# Patient Record
Sex: Male | Born: 1970 | Race: White | Hispanic: No | Marital: Married | Smoking: Never smoker
Health system: Southern US, Community
[De-identification: ages and names within clinical notes are randomized; demographics above are authoritative.]

## PROBLEM LIST (undated history)

## (undated) ENCOUNTER — Emergency Department (HOSPITAL_COMMUNITY): Admission: EM | Discharge status: Absent without leave | Payer: Self-pay

## (undated) DIAGNOSIS — E7800 Pure hypercholesterolemia, unspecified: Secondary | ICD-10-CM

## (undated) DIAGNOSIS — E119 Type 2 diabetes mellitus without complications: Secondary | ICD-10-CM

## (undated) HISTORY — PX: HERNIA REPAIR: SHX51

## (undated) HISTORY — DX: Pure hypercholesterolemia, unspecified: E78.00

## (undated) HISTORY — DX: Type 2 diabetes mellitus without complications: E11.9

---

## 2003-06-07 ENCOUNTER — Ambulatory Visit (HOSPITAL_COMMUNITY): Admission: RE | Admit: 2003-06-07 | Discharge: 2003-06-07 | Payer: Self-pay | Admitting: Family Medicine

## 2003-06-07 ENCOUNTER — Encounter: Payer: Self-pay | Admitting: Family Medicine

## 2003-06-19 ENCOUNTER — Ambulatory Visit (HOSPITAL_COMMUNITY): Admission: RE | Admit: 2003-06-19 | Discharge: 2003-06-19 | Payer: Self-pay | Admitting: Family Medicine

## 2003-06-19 ENCOUNTER — Encounter: Payer: Self-pay | Admitting: Family Medicine

## 2007-07-27 ENCOUNTER — Ambulatory Visit (HOSPITAL_COMMUNITY): Admission: RE | Admit: 2007-07-27 | Discharge: 2007-07-27 | Payer: Self-pay | Admitting: General Surgery

## 2007-09-13 ENCOUNTER — Ambulatory Visit (HOSPITAL_COMMUNITY): Admission: RE | Admit: 2007-09-13 | Discharge: 2007-09-13 | Payer: Self-pay | Admitting: General Surgery

## 2011-03-02 NOTE — Op Note (Signed)
NAME:  Micheal Nelson, Micheal Nelson            ACCOUNT NO.:  [id number]   MEDICAL RECORD NO.:  [id number]          PATIENT TYPE:  AMB   LOCATION:  DAY                           FACILITY:  [facility]   PHYSICIAN:  Tilford Pillar, MD      DATE OF BIRTH:  [birth year]   DATE OF PROCEDURE:  09/13/2007  DATE OF DISCHARGE:                               OPERATIVE REPORT   PREOPERATIVE DIAGNOSIS:  Epigastric hernia.   POSTOPERATIVE DIAGNOSIS:  Epigastric hernia.   PROCEDURE:  Laparoscopic epigastric hernia repair with 10 x 15 cm  Proceed trimmed to 10 x 10 cm mesh repair.   SURGEON:  Tilford Pillar, M.D.   ANESTHESIA:  General endotracheal.  Local anesthetic; 1% Sensorcaine  plain.   ESTIMATED BLOOD LOSS:  Minimal.   SPECIMENS:  None.   INDICATIONS FOR PROCEDURE:  The patient is a 40 year old male who  presented to my office as an outpatient with a notable bulge in his  epigastric region.  He said that he had noticed this approximately two  years prior and it had slowly increased in size.  He had not had any  signs or symptoms consistent with incarceration or strangulation.  On  initial evaluation, I was not able to palpate the defect and a CT  evaluation of the abdomen and pelvis was obtained which clearly  demonstrated a small epigastric hernia.  The risks, benefits, and  alternatives of a laparoscopic versus open hernia repair with mesh were  discussed at length with the patient.  The patient's questions and  concerns were addressed and the patient was consented for the planned  laparoscopic hernia repair.   DESCRIPTION OF PROCEDURE:  The patient was taken to the operating room  and was placed in the supine position on the operating table at which  time general anesthesia was administered.  Once the patient was asleep,  he was endotracheally intubated by anesthesia.  At this point a Foley  catheter was placed using sterile technique by myself and then his  abdomen was prepped and draped in the usual  fashion.  A stab incision  was made in the left upper quadrant at Palmer's point and the Veress  needle was inserted.  Saline drop test was utilized to confirm  intraperitoneal placement before initiating pneumoperitoneum.  Once  sufficient pneumoperitoneum was obtained, an incision was made in the  right lateral abdominal wall for placement of a 10 mm trocar.  This  trocar was placed over the laparoscope to visualize entrance into the  peritoneal cavity.  With the trocar in place, the inner cannula was  removed.  The laparoscope was reinserted.  There was no evidence of any  trocar or Veress needle placement injury.  The Veress needle was removed  at this point and at this point a 5 mm trocar was placed inferior to the  10 mm trocar.  This was placed under direct visualization with the  laparoscope.  At this point electrocautery was utilized to dissect the  falciform ligament free from the anterior abdominal wall allowing an  adequate exposure for the epigastric hernia that was midline of  the  falciform ligament.  At this point the 30 degree laparoscope was  inserted and the hernia was probed with a regular grasper.  There was no  incarcerated omental fat or intra-abdominal contents within the hernia.  At this point attention was turned to measuring area around the defect.   Using palpation of the anterior abdominal wall and a marking pen to mark  the plane of the pexying sutures.  The defect was measured and the  pneumoperitoneum was briefly evacuated to allow adequate extracorporeal  measurement which measured approximately 10 x 10 cm.  At this point  pneumoperitoneum was returned.  A 10 x 15 cm piece of Proceed was  brought to the field.  This was trimmed to the appropriate size, marked  for positioning, and using 2.0 Novafil sutures for the pexying sutures,  these were placed at all four quadrants of the mesh.  This was then  rolled and placed in through the 11 mm trocar.  At this  point, the  laparoscope was reinserted into the abdominal cavity.  Additional 5 mm  trocar was placed in the left lateral abdominal wall.  The mesh was  unrolled, positioned appropriately, and through stab incisions at the  four quadrants, the previously marked pexying sites, the Novafil sutures  were pulled through the anterior abdominal wall using an Endoclose  suture passing device.  The sutures were temporarily secured using  Hemostats and once all four quadrants were passed through the anterior  abdominal wall, the sutures were pulled taut to assure appropriate lie  of the mesh against the anterior abdominal wall.  The mesh was in good  position, the sutures were secured and trimmed.  At this point a  Protacker spiral stapling device was brought to the field and was  utilized to tack the mesh circumferentially.  At this point the mesh was  lying in good position.  The pneumoperitoneum was evacuated.  The  trocars were removed.  A 2-0 Vicryl on a UR needle was utilized to  reapproximate the fascia at the 10 mm trocar site.  The skin was washed  and dried with a moist to dry towel.  4-0 Monocryl was utilized to  reapproximate the skin edges at all three trocar sites.  The skin was  then washed and dried with a moist to dry towel again and Benzoin was  applied around all trocar sites and stab incisions.  Half inch Steri-  Strips were placed over the incisions.  The patient was allowed to come  out of general anesthesia and was transferred back to a regular hospital  bed.  The Foley was removed prior to leaving the operating room.  He was  transferred to the postanesthesia care unit in stable condition.  At the  conclusion of the procedure, all needle, sponge, and instrument counts  correct.  The patient tolerated the procedure well.      Tilford Pillar, MD  Electronically Signed     BZ/MEDQ  D:  09/13/2007  T:  09/13/2007  Job:  [id number]

## 2011-03-02 NOTE — H&P (Signed)
NAME:  Micheal Nelson, Micheal Nelson            ACCOUNT NO.:  [id number]   MEDICAL RECORD NO.:  [id number]          PATIENT TYPE:  AMB   LOCATION:  DAY                           FACILITY:  [facility]   PHYSICIAN:  Tilford Pillar, MD      DATE OF BIRTH:  [birth year]   DATE OF ADMISSION:  DATE OF DISCHARGE:  LH                              HISTORY & PHYSICAL   CHIEF COMPLAINT:  Abdominal bulge.   HISTORY OF PRESENT ILLNESS:  The patient is a 40 year old male who  noticed increasing bulge in his epigastric region.  He has had no prior  history of surgeries or prior hernias.  He noted that this has increased  slowly in size over the last several months with some burning discomfort  around the area.  He states he actually noticed the initial bulge more  than 10 years ago but had never thought much of it.  He has had no  history of nausea or vomiting.  No fever or chills or any bowel changes.  He is otherwise a healthy individual.   PAST MEDICAL HISTORY:  None.   PAST SURGICAL HISTORY:  None.   MEDICATIONS:  None.   ALLERGIES:  No known drug allergies.   SOCIAL HISTORY:  No tobacco use.  No alcohol use.  No recreational drug  use.  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   FAMILY HISTORY:  He has no pertinent family history.   REVIEW OF SYSTEMS:  CONSTITUTIONAL:  Unremarkable.  EYES:  Unremarkable.  EARS, NOSE AND THROAT:  Unremarkable.  RESPIRATORY:  Occasional cough.  CARDIOVASCULAR:  Unremarkable.  GASTROINTESTINAL:  Above-mentioned  bulging.  GENITOURINARY:  Unremarkable.  MUSCULOSKELETAL:  Unremarkable.  SKIN:  Unremarkable.  ENDOCRINE:  Unremarkable.  NEUROLOGICAL:  Unremarkable.   PHYSICAL EXAMINATION:  GENERAL APPEARANCE:  This is a healthy, mildly  obese, male who is in no acute distress.  He is alert and oriented x3.  HEENT:  Scalp has no deformities, no masses.  Eyes:  Pupils equal,  round, reactive.  Extraocular movements  are intact. On oral evaluation  the mucosa is pink.  Normal occlusion.  NECK:  Exam reveals midline trachea.  No thyroid nodules.  No cervical  lymphadenopathy.  PULMONARY:  Unlabored respirations.  Clear to auscultation bilaterally.  CARDIOVASCULAR:  Has 2+ radial pulses.  Regular rate and rhythm.  ABDOMEN:  Positive bowel sounds.  Abdomen is soft.  No tenderness is  appreciated.  There is noted bulging of both his umbilicus and his  epigastric region.   PERTINENT LABORATORY AND RADIOGRAPHIC STUDIES:  CT scan evaluation of  abdomen and pelvis demonstrates a small ventral hernia, no incarcerated  bowel is noted within the hernia.   ASSESSMENT/PLAN:  Small epigastric hernia.  We have discussed with the  patient the risks, benefits and alternatives of laparoscopic or possible  open ventral hernia repair.  Initially, the difference between diastasis  and hernia were discussed at length with the patient due to the unusual  location without prior intra-abdominal surgery.  However, an epigastric  hernia  was confirmed with the CT scan evaluation of the patient's  abdomen.  The surgical risks for both laparoscopic and open techniques  were discussed with the patient including, but not limited to, the  possibility of infection, bleeding, mesh infection with required removal  of mesh and interval hernia repair, bowel injury as well as the  possibility of intraoperative cardiac and pulmonary events.  The patient  is comfortable with proceeding and wishes to proceed with laparoscopic  hernia repair.  This will be scheduled for September 13, 2007 at [facility].      Tilford Pillar, MD  Electronically Signed     BZ/MEDQ  D:  08/21/2007  T:  08/22/2007  Job:  [id number]   cc:   [facility]  Fax: [phone]   Melony Overly, Primary Care Physician

## 2011-07-27 LAB — CBC
HCT: 42.5
Platelets: 278
RBC: 4.74

## 2011-07-27 LAB — BASIC METABOLIC PANEL
BUN: 9
Chloride: 100
Creatinine, Ser: 0.79
GFR calc Af Amer: >60
GFR calc non Af Amer: >60
Glucose, Bld: 103 — ABNORMAL HIGH
Potassium: 3.9

## 2017-05-13 MED FILL — PHENTERMINE 37.5 MG TABLET: 37.5 | 30 days supply | Qty: 30 | Fill #0

## 2017-06-10 MED FILL — PHENTERMINE 37.5 MG TABLET: 37.5 | 30 days supply | Qty: 30 | Fill #0

## 2017-07-08 MED FILL — PHENTERMINE 37.5 MG TABLET: 37.5 | 30 days supply | Qty: 30 | Fill #0

## 2017-07-13 DIAGNOSIS — Z23 Encounter for immunization: Secondary | ICD-10-CM | POA: Diagnosis not present

## 2017-08-10 MED FILL — PHENTERMINE 37.5 MG TABLET: 37.5 | 30 days supply | Qty: 30 | Fill #0

## 2018-01-30 ENCOUNTER — Other Ambulatory Visit: Payer: Self-pay

## 2018-01-30 ENCOUNTER — Emergency Department (HOSPITAL_COMMUNITY)
Admission: EM | Admit: 2018-01-30 | Discharge: 2018-01-30 | Disposition: A | Discharge status: Home / Self Care | Payer: No Typology Code available for payment source | Attending: Emergency Medicine | Admitting: Emergency Medicine

## 2018-01-30 ENCOUNTER — Encounter (HOSPITAL_COMMUNITY): Payer: Self-pay | Admitting: Emergency Medicine

## 2018-01-30 ENCOUNTER — Emergency Department (HOSPITAL_COMMUNITY): Payer: No Typology Code available for payment source

## 2018-01-30 DIAGNOSIS — Y92410 Unspecified street and highway as the place of occurrence of the external cause: Secondary | ICD-10-CM | POA: Diagnosis not present

## 2018-01-30 DIAGNOSIS — Z87891 Personal history of nicotine dependence: Secondary | ICD-10-CM | POA: Insufficient documentation

## 2018-01-30 DIAGNOSIS — Y939 Activity, unspecified: Secondary | ICD-10-CM | POA: Insufficient documentation

## 2018-01-30 DIAGNOSIS — S0083XA Contusion of other part of head, initial encounter: Secondary | ICD-10-CM | POA: Diagnosis not present

## 2018-01-30 DIAGNOSIS — Z79899 Other long term (current) drug therapy: Secondary | ICD-10-CM | POA: Diagnosis not present

## 2018-01-30 DIAGNOSIS — Y999 Unspecified external cause status: Secondary | ICD-10-CM | POA: Insufficient documentation

## 2018-01-30 DIAGNOSIS — S0093XA Contusion of unspecified part of head, initial encounter: Secondary | ICD-10-CM

## 2018-01-30 DIAGNOSIS — S0990XA Unspecified injury of head, initial encounter: Secondary | ICD-10-CM | POA: Diagnosis present

## 2018-01-30 MED ORDER — IBUPROFEN 800 MG PO TABS: 800.0000 mg | ORAL_TABLET | Freq: Three times a day (TID) | ORAL | 0 refills | Status: DC

## 2018-01-30 MED ORDER — CYCLOBENZAPRINE HCL 10 MG PO TABS: 10.0000 mg | ORAL_TABLET | Freq: Two times a day (BID) | ORAL | 0 refills | Status: DC | PRN

## 2018-01-30 NOTE — Discharge Instructions (Signed)
Xrays are normal - no fractures You will likely feel worse tomorrow than you do today Ibuprofen 800 mg every 8 hours for pain for several days then as needed Flexeril 10 mg by mouth as needed for muscle strain or tension
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
## 2018-02-06 DIAGNOSIS — S161XXA Strain of muscle, fascia and tendon at neck level, initial encounter: Secondary | ICD-10-CM | POA: Diagnosis not present

## 2018-02-06 DIAGNOSIS — Z1389 Encounter for screening for other disorder: Secondary | ICD-10-CM | POA: Diagnosis not present

## 2018-02-06 DIAGNOSIS — Z6841 Body Mass Index (BMI) 40.0 and over, adult: Secondary | ICD-10-CM | POA: Diagnosis not present

## 2018-02-22 ENCOUNTER — Encounter (HOSPITAL_COMMUNITY): Payer: Self-pay | Admitting: Emergency Medicine

## 2018-02-22 ENCOUNTER — Emergency Department (HOSPITAL_COMMUNITY)
Admission: EM | Admit: 2018-02-22 | Discharge: 2018-02-23 | Disposition: A | Discharge status: Home / Self Care | Payer: 59 | Attending: Emergency Medicine | Admitting: Emergency Medicine

## 2018-02-22 ENCOUNTER — Other Ambulatory Visit: Payer: Self-pay

## 2018-02-22 DIAGNOSIS — X509XXA Other and unspecified overexertion or strenuous movements or postures, initial encounter: Secondary | ICD-10-CM | POA: Insufficient documentation

## 2018-02-22 DIAGNOSIS — Y939 Activity, unspecified: Secondary | ICD-10-CM | POA: Diagnosis not present

## 2018-02-22 DIAGNOSIS — M79671 Pain in right foot: Secondary | ICD-10-CM | POA: Diagnosis not present

## 2018-02-22 DIAGNOSIS — Y999 Unspecified external cause status: Secondary | ICD-10-CM | POA: Diagnosis not present

## 2018-02-22 DIAGNOSIS — S92152A Displaced avulsion fracture (chip fracture) of left talus, initial encounter for closed fracture: Secondary | ICD-10-CM | POA: Insufficient documentation

## 2018-02-22 DIAGNOSIS — S99911A Unspecified injury of right ankle, initial encounter: Secondary | ICD-10-CM | POA: Diagnosis present

## 2018-02-22 DIAGNOSIS — Z79899 Other long term (current) drug therapy: Secondary | ICD-10-CM | POA: Insufficient documentation

## 2018-02-22 DIAGNOSIS — S99921A Unspecified injury of right foot, initial encounter: Secondary | ICD-10-CM | POA: Diagnosis not present

## 2018-02-22 DIAGNOSIS — S92251A Displaced fracture of navicular [scaphoid] of right foot, initial encounter for closed fracture: Secondary | ICD-10-CM | POA: Diagnosis not present

## 2018-02-22 DIAGNOSIS — S82891A Other fracture of right lower leg, initial encounter for closed fracture: Secondary | ICD-10-CM

## 2018-02-22 DIAGNOSIS — Y92481 Parking lot as the place of occurrence of the external cause: Secondary | ICD-10-CM | POA: Insufficient documentation

## 2018-02-22 DIAGNOSIS — S92151A Displaced avulsion fracture (chip fracture) of right talus, initial encounter for closed fracture: Secondary | ICD-10-CM | POA: Diagnosis not present

## 2018-02-22 DIAGNOSIS — M7989 Other specified soft tissue disorders: Secondary | ICD-10-CM | POA: Diagnosis not present

## 2018-02-22 NOTE — ED Triage Notes (Signed)
Patient went out for dinner earlier and fell getting out of his truck.  Patient states that his foot went under and turned when he stepped out.  Patient is unable to put weight on his foot now and has swelling to foot/ankle.

## 2018-02-23 ENCOUNTER — Emergency Department (HOSPITAL_COMMUNITY): Payer: 59

## 2018-02-23 DIAGNOSIS — S99921A Unspecified injury of right foot, initial encounter: Secondary | ICD-10-CM | POA: Diagnosis not present

## 2018-02-23 DIAGNOSIS — Z79899 Other long term (current) drug therapy: Secondary | ICD-10-CM | POA: Diagnosis not present

## 2018-02-23 DIAGNOSIS — M79671 Pain in right foot: Secondary | ICD-10-CM | POA: Diagnosis not present

## 2018-02-23 DIAGNOSIS — S92152A Displaced avulsion fracture (chip fracture) of left talus, initial encounter for closed fracture: Secondary | ICD-10-CM | POA: Diagnosis not present

## 2018-02-23 DIAGNOSIS — S92151A Displaced avulsion fracture (chip fracture) of right talus, initial encounter for closed fracture: Secondary | ICD-10-CM | POA: Diagnosis not present

## 2018-02-23 DIAGNOSIS — M7989 Other specified soft tissue disorders: Secondary | ICD-10-CM | POA: Diagnosis not present

## 2018-02-23 MED ORDER — HYDROCODONE-ACETAMINOPHEN 5-325 MG PO TABS: 1.0000 | ORAL_TABLET | Freq: Four times a day (QID) | ORAL | 0 refills | Status: DC | PRN

## 2018-02-23 MED ORDER — IBUPROFEN 600 MG PO TABS: 600.0000 mg | ORAL_TABLET | Freq: Four times a day (QID) | ORAL | 0 refills | Status: DC | PRN

## 2018-02-23 NOTE — ED Provider Notes (Signed)
[facility] EMERGENCY DEPARTMENT Provider Note   CSN: [id number] Arrival date & time: 02/22/18  2332     History   Chief Complaint Chief Complaint  Patient presents with  . Foot Pain  . Ankle Pain    HPI Micheal Nelson is a 47 y.o. male.  HPI   Micheal Nelson is a 47 y.o. male, patient with no pertinent past medical history, presenting to the ED with right foot and ankle pain beginning evening of 5/8.  States he slipped getting out of a vehicle and his right foot plantar flexed under his weight.  Pain is currently moderate, throbbing, nonradiating.  Improved with ibuprofen.  Denies numbness, head injury, neck/back pain, or any other complaints.     History reviewed. No pertinent past medical history.  There are no active problems to display for this patient.   Past Surgical History:  Procedure Laterality Date  . HERNIA REPAIR          Home Medications    Prior to Admission medications   Medication Sig Start Date End Date Taking? Authorizing Provider  cetirizine (ZYRTEC) 10 MG tablet Take 10 mg by mouth daily.    [provider]  cyclobenzaprine (FLEXERIL) 10 MG tablet Take 1 tablet (10 mg total) by mouth 2 (two) times daily as needed for muscle spasms. 01/30/18   Eber Hong, MD  HYDROcodone-acetaminophen (NORCO/VICODIN) 5-325 MG tablet Take 1-2 tablets by mouth every 6 (six) hours as needed for severe pain. 02/23/18   Maijor Hornig C, PA-C  ibuprofen (ADVIL,MOTRIN) 600 MG tablet Take 1 tablet (600 mg total) by mouth every 6 (six) hours as needed. 02/23/18   Tailor Lucking, Hillard Danker, PA-C    Family History No family history on file.  Social History Social History   Tobacco Use  . Smoking status: Never Smoker  . Smokeless tobacco: Former Neurosurgeon    Types: Chew  Substance Use Topics  . Alcohol use: Never    Frequency: Never  . Drug use: Never     Allergies   Patient has no known allergies.   Review of Systems Review of Systems    Musculoskeletal: Positive for arthralgias.  Skin: Negative for wound.  Neurological: Negative for numbness.     Physical Exam Updated Vital Signs BP 117/81 (BP Location: Right Arm)   Pulse 88   Temp 98.9 F (37.2 C) (Oral)   Resp 16   Ht  (1.803 m)   Wt 122.5 kg (270 lb)   SpO2 97%   BMI 37.66 kg/m   Physical Exam  Constitutional: He appears well-developed and well-nourished. No distress.  HENT:  Head: Normocephalic and atraumatic.  Eyes: Conjunctivae are normal.  Neck: Neck supple.  Cardiovascular: Normal rate, regular rhythm and intact distal pulses.  Pulmonary/Chest: Effort normal.  Musculoskeletal: He exhibits tenderness. He exhibits no deformity.       Feet:  Neurological: He is alert.  Sensation intact in the right foot. Strength with plantar flexion 5/5 in the right ankle.  Strength 4/5 with dorsiflexion. Limping, antalgic gait.  Skin: Skin is warm and dry. Capillary refill takes less than 2 seconds. He is not diaphoretic. No pallor.  Psychiatric: He has a normal mood and affect. His behavior is normal.  Nursing note and vitals reviewed.    ED Treatments / Results  Labs (all labs ordered are listed, but only abnormal results are displayed) Labs Reviewed - No data to display  EKG None  Radiology Dg Ankle Complete  Right  Result Date: 02/23/2018 CLINICAL DATA:  Swelling and pain after fall. EXAM: RIGHT ANKLE - COMPLETE 3+ VIEW COMPARISON:  None. FINDINGS: Small avulsion fractures off the dorsum of the anterior talus and navicular. Soft tissue swelling about the malleoli. Intact ankle and subtalar joints. Calcaneal enthesopathy is seen along plantar dorsal aspect. IMPRESSION: 1. Tiny avulsion fractures off the dorsum of the anterior talus and navicular bones. No joint dislocation. 2. Soft tissue swelling. 3. Calcaneal enthesopathy. Electronically Signed   By: Tollie Eth M.D.   On: 02/23/2018 01:49   Dg Foot Complete Right  Result Date:  02/23/2018 CLINICAL DATA:  Swelling and pain after fall. EXAM: RIGHT FOOT COMPLETE - 3+ VIEW COMPARISON:  None. FINDINGS: Soft tissue swelling about the malleoli and dorsum of the foot. Tiny avulsions across the talonavicular joint involving the dorsum of the anterior talus and tarsal navicular. No joint dislocation. Calcaneal enthesopathy is seen. Intact Lisfranc articulation. Intact Chopart joint. IMPRESSION: Tiny flake avulsions off the dorsum of the anterior talus and navicular bones. Associated soft tissue swelling is noted. Electronically Signed   By: Tollie Eth M.D.   On: 02/23/2018 01:51    Procedures Procedures (including critical care time)  Medications Ordered in ED Medications - No data to display   Initial Impression / Assessment and Plan / ED Course  I have reviewed the triage vital signs and the nursing notes.  Pertinent labs & imaging results that were available during my care of the patient were reviewed by me and considered in my medical decision making (see chart for details).     Patient presents with a right foot and ankle injury.  Avulsion fracture is noted to dorsum of talus and navicular bones.  Patient has plantar and dorsiflexion intact.  Neurovascularly intact.  Placed in a Cam boot, given crutches, orthopedic follow-up. The patient was given instructions for home care as well as return precautions. Patient voices understanding of these instructions, accepts the plan, and is comfortable with discharge.  Final Clinical Impressions(s) / ED Diagnoses   Final diagnoses:  Closed avulsion fracture of right ankle, initial encounter    ED Discharge Orders        Ordered    ibuprofen (ADVIL,MOTRIN) 600 MG tablet  Every 6 hours PRN     02/23/18 0258    HYDROcodone-acetaminophen (NORCO/VICODIN) 5-325 MG tablet  Every 6 hours PRN     02/23/18 0258       Anselm Pancoast, PA-C 02/23/18 0311    Dione Booze, MD 02/23/18 [phone]

## 2018-02-23 NOTE — Discharge Instructions (Signed)
You have been seen today for an ankle injury.  There were noted avulsion fractures to the ankle. Pain: Take 600 mg of ibuprofen every 6 hours or 440 mg (over the counter dose) to 500 mg (prescription dose) of naproxen every 12 hours for the next 3 days. After this time, these medications may be used as needed for pain. Take these medications with food to avoid upset stomach. Choose only one of these medications, do not take them together.  Tylenol: Should you continue to have additional pain while taking the ibuprofen or naproxen, you may add in tylenol as needed. Your daily total maximum amount of tylenol from all sources should be limited to /day for persons without liver problems, or /day for those with liver problems. Vicodin: May take Vicodin as needed for severe pain.  Do not drive or perform other dangerous activities while taking the Vicodin.  Please note that each pill of Vicodin contains 325 mg of Tylenol and the above dosage limits apply. Ice: May apply ice to the area over the next 24 hours for 15 minutes at a time to reduce swelling. Elevation: Keep the extremity elevated as often as possible to reduce pain and inflammation. Support: Wear the cam boot for support and comfort. Wear this until pain resolves.  Exercises: Start by performing these exercises a few times a week, increasing the frequency until you are performing them twice daily.  Follow up: Follow-up with the orthopedic specialist on this matter.  Call the number provided to set up an appointment.

## 2018-02-23 NOTE — Progress Notes (Signed)
Orthopedic Tech Progress Note Patient Details:  Micheal Nelson [birth year] [id number]  Ortho Devices Type of Ortho Device: Crutches, CAM walker Ortho Device/Splint Location: rle Ortho Device/Splint Interventions: Ordered, Application, Adjustment   Post Interventions Patient Tolerated: Well Instructions Provided: Care of device, Adjustment of device   [name] 02/23/2018, 3:39 AM

## 2018-03-07 DIAGNOSIS — M25571 Pain in right ankle and joints of right foot: Secondary | ICD-10-CM | POA: Insufficient documentation

## 2018-03-31 MED FILL — PHENTERMINE 37.5 MG TABLET: 37.5 | 30 days supply | Qty: 30 | Fill #0

## 2018-04-12 ENCOUNTER — Encounter: Payer: Self-pay | Admitting: Family Medicine

## 2018-04-12 ENCOUNTER — Ambulatory Visit (INDEPENDENT_AMBULATORY_CARE_PROVIDER_SITE_OTHER): Payer: Self-pay | Admitting: Family Medicine

## 2018-04-12 VITALS — BP 110/82 | HR 84 | Temp 98.4°F | Ht 71.0 in | Wt 284.0 lb

## 2018-04-12 DIAGNOSIS — Z0000 Encounter for general adult medical examination without abnormal findings: Secondary | ICD-10-CM

## 2018-04-12 NOTE — Patient Instructions (Signed)

## 2018-04-12 NOTE — Progress Notes (Signed)
Micheal Nelson is a 47 y.o. male who presents today with concerns of need for an annual physical exam. He denies any chronic health conditions at this time.  Review of Systems  Constitutional: Negative for chills, fever and malaise/fatigue.  HENT: Negative for congestion, ear discharge, ear pain, sinus pain and sore throat.   Eyes: Negative.   Respiratory: Negative for cough, sputum production and shortness of breath.   Cardiovascular: Negative.  Negative for chest pain.  Gastrointestinal: Negative for abdominal pain, diarrhea, nausea and vomiting.  Genitourinary: Negative for dysuria, frequency, hematuria and urgency.  Musculoskeletal: Negative for myalgias.  Skin: Negative.   Neurological: Negative for headaches.  Endo/Heme/Allergies: Negative.   Psychiatric/Behavioral: Negative.    O: Vitals:   04/12/18 0819  BP: 110/82  Pulse: 84  Temp: 98.4 F (36.9 C)  SpO2: 97%   Physical Exam  Constitutional: He is oriented to person, place, and time. Vital signs are normal. He appears well-developed and well-nourished. He is active.  Non-toxic appearance. He does not have a sickly appearance.  HENT:  Head: Normocephalic.  Right Ear: Hearing, tympanic membrane, external ear and ear canal normal.  Left Ear: Hearing, tympanic membrane, external ear and ear canal normal.  Nose: Nose normal.  Mouth/Throat: Uvula is midline and oropharynx is clear and moist.  Neck: Normal range of motion. Neck supple.  Cardiovascular: Normal rate, regular rhythm, normal heart sounds and normal pulses.  Pulmonary/Chest: Effort normal and breath sounds normal.  Abdominal: Soft. Bowel sounds are normal.  Musculoskeletal: Normal range of motion.  Lymphadenopathy:       Head (right side): No submental and no submandibular adenopathy present.       Head (left side): No submental and no submandibular adenopathy present.    He has no cervical adenopathy.  Neurological: He is alert and oriented to person,  place, and time.  Psychiatric: He has a normal mood and affect.  Vitals reviewed.  A: 1. Physical exam    P: Exam findings, diagnosis etiology and medication use and indications reviewed with patient. Follow- Up and discharge instructions provided. No emergent/urgent issues found on exam.  Patient verbalized understanding of information provided and agrees with plan of care (POC), all questions answered.  1. Physical exam WNL

## 2018-05-31 MED FILL — PHENTERMINE 37.5 MG TABLET: 37.5 | 30 days supply | Qty: 30 | Fill #0

## 2018-07-05 MED FILL — PHENTERMINE 37.5 MG TABLET: 37.5 | 30 days supply | Qty: 30 | Fill #0

## 2018-07-11 ENCOUNTER — Encounter: Payer: Self-pay | Admitting: Family Medicine

## 2018-07-11 ENCOUNTER — Ambulatory Visit (INDEPENDENT_AMBULATORY_CARE_PROVIDER_SITE_OTHER): Payer: Self-pay | Admitting: Family Medicine

## 2018-07-11 VITALS — BP 110/80 | HR 94 | Temp 98.3°F | Wt 285.6 lb

## 2018-07-11 DIAGNOSIS — J329 Chronic sinusitis, unspecified: Secondary | ICD-10-CM

## 2018-07-11 MED ORDER — DM-GUAIFENESIN ER 30-600 MG PO TB12: 2.0000 | ORAL_TABLET | Freq: Two times a day (BID) | ORAL | 0 refills | Status: AC

## 2018-07-11 MED ORDER — AMOXICILLIN-POT CLAVULANATE 875-125 MG PO TABS: 1.0000 | ORAL_TABLET | Freq: Two times a day (BID) | ORAL | 0 refills | Status: AC

## 2018-07-11 MED ORDER — BENZONATATE 200 MG PO CAPS: 200.0000 mg | ORAL_CAPSULE | Freq: Three times a day (TID) | ORAL | 0 refills | Status: DC | PRN

## 2018-07-11 MED FILL — AMOX-CLAV 875-125 MG TABLET: 875-125 | 7 days supply | Qty: 14 | Fill #0

## 2018-07-11 MED FILL — BENZONATATE 200 MG CAPS: 200 | 7 days supply | Qty: 20 | Fill #0

## 2018-07-11 MED FILL — MUCINEX DM ER 600-30 MG TAB: 30-600 | 4 days supply | Qty: 16 | Fill #0

## 2018-07-11 NOTE — Patient Instructions (Addendum)
PLAN< Increase hydration Take antibiotic as prescribed-complete course Take cough medication as needed  Sinusitis, Adult Sinusitis is soreness and inflammation of your sinuses. Sinuses are hollow spaces in the bones around your face. Your sinuses are located:  Around your eyes.  In the middle of your forehead.  Behind your nose.  In your cheekbones.  Your sinuses and nasal passages are lined with a stringy fluid (mucus). Mucus normally drains out of your sinuses. When your nasal tissues become inflamed or swollen, the mucus can become trapped or blocked so air cannot flow through your sinuses. This allows bacteria, viruses, and funguses to grow, which leads to infection. Sinusitis can develop quickly and last for 7?10 days (acute) or for more than 12 weeks (chronic). Sinusitis often develops after a cold. What are the causes? This condition is caused by anything that creates swelling in the sinuses or stops mucus from draining, including:  Allergies.  Asthma.  Bacterial or viral infection.  Abnormally shaped bones between the nasal passages.  Nasal growths that contain mucus (nasal polyps).  Narrow sinus openings.  Pollutants, such as chemicals or irritants in the air.  A foreign object stuck in the nose.  A fungal infection. This is rare.  What increases the risk? The following factors may make you more likely to develop this condition:  Having allergies or asthma.  Having had a recent cold or respiratory tract infection.  Having structural deformities or blockages in your nose or sinuses.  Having a weak immune system.  Doing a lot of swimming or diving.  Overusing nasal sprays.  Smoking.  What are the signs or symptoms? The main symptoms of this condition are pain and a feeling of pressure around the affected sinuses. Other symptoms include:  Upper toothache.  Earache.  Headache.  Bad breath.  Decreased sense of smell and taste.  A cough that may  get worse at night.  Fatigue.  Fever.  Thick drainage from your nose. The drainage is often green and it may contain pus (purulent).  Stuffy nose or congestion.  Postnasal drip. This is when extra mucus collects in the throat or back of the nose.  Swelling and warmth over the affected sinuses.  Sore throat.  Sensitivity to light.  How is this diagnosed? This condition is diagnosed based on symptoms, a medical history, and a physical exam. To find out if your condition is acute or chronic, your health care provider may:  Look in your nose for signs of nasal polyps.  Tap over the affected sinus to check for signs of infection.  View the inside of your sinuses using an imaging device that has a light attached (endoscope).  If your health care provider suspects that you have chronic sinusitis, you may also:  Be tested for allergies.  Have a sample of mucus taken from your nose (nasal culture) and checked for bacteria.  Have a mucus sample examined to see if your sinusitis is related to an allergy.  If your sinusitis does not respond to treatment and it lasts longer than 8 weeks, you may have an MRI or CT scan to check your sinuses. These scans also help to determine how severe your infection is. In rare cases, a bone biopsy may be done to rule out more serious types of fungal sinus disease. How is this treated? Treatment for sinusitis depends on the cause and whether your condition is chronic or acute. If a virus is causing your sinusitis, your symptoms will go away on  their own within 10 days. You may be given medicines to relieve your symptoms, including:  Topical nasal decongestants. They shrink swollen nasal passages and let mucus drain from your sinuses.  Antihistamines. These drugs block inflammation that is triggered by allergies. This can help to ease swelling in your nose and sinuses.  Topical nasal corticosteroids. These are nasal sprays that ease inflammation and  swelling in your nose and sinuses.  Nasal saline washes. These rinses can help to get rid of thick mucus in your nose.  If your condition is caused by bacteria, you will be given an antibiotic medicine. If your condition is caused by a fungus, you will be given an antifungal medicine. Surgery may be needed to correct underlying conditions, such as narrow nasal passages. Surgery may also be needed to remove polyps. Follow these instructions at home: Medicines  Take, use, or apply over-the-counter and prescription medicines only as told by your health care provider. These may include nasal sprays.  If you were prescribed an antibiotic medicine, take it as told by your health care provider. Do not stop taking the antibiotic even if you start to feel better. Hydrate and Humidify  Drink enough water to keep your urine clear or pale yellow. Staying hydrated will help to thin your mucus.  Use a cool mist humidifier to keep the humidity level in your home above 50%.  Inhale steam for 10-15 minutes, 3-4 times a day or as told by your health care provider. You can do this in the bathroom while a hot shower is running.  Limit your exposure to cool or dry air. Rest  Rest as much as possible.  Sleep with your head raised (elevated).  Make sure to get enough sleep each night. General instructions  Apply a warm, moist washcloth to your face 3-4 times a day or as told by your health care provider. This will help with discomfort.  Wash your hands often with soap and water to reduce your exposure to viruses and other germs. If soap and water are not available, use hand sanitizer.  Do not smoke. Avoid being around people who are smoking (secondhand smoke).  Keep all follow-up visits as told by your health care provider. This is important. Contact a health care provider if:  You have a fever.  Your symptoms get worse.  Your symptoms do not improve within 10 days. Get help right away  if:  You have a severe headache.  You have persistent vomiting.  You have pain or swelling around your face or eyes.  You have vision problems.  You develop confusion.  Your neck is stiff.  You have trouble breathing. This information is not intended to replace advice given to you by your health care provider. Make sure you discuss any questions you have with your health care provider. Document Released: 10/04/2005 Document Revised: 05/30/2016 Document Reviewed: 07/30/2015 Elsevier Interactive Patient Education  [employer].

## 2018-07-11 NOTE — Progress Notes (Signed)
Micheal Nelson is a 47 y.o. male who presents today with concerns of cough, congestion, sore throat and hoarseness for the last 7 days. He has attempted to use multiple over the counter options without any real relief of symptoms. He denies fever of known sick work or home contacts. He reports after increasing his water intake he was able to begin bringing up more sputum. He reports that congestion is not worse at any point in time during the day.  Review of Systems  Constitutional: Negative for chills, fever and malaise/fatigue.  HENT: Positive for congestion and sore throat. Negative for ear discharge, ear pain and sinus pain.   Eyes: Negative.   Respiratory: Positive for cough and sputum production. Negative for shortness of breath.   Cardiovascular: Negative.  Negative for chest pain.  Gastrointestinal: Negative for abdominal pain, diarrhea, nausea and vomiting.  Genitourinary: Negative for dysuria, frequency, hematuria and urgency.  Musculoskeletal: Negative for myalgias.  Skin: Negative.   Neurological: Negative for headaches.  Endo/Heme/Allergies: Negative.   Psychiatric/Behavioral: Negative.     O: Vitals:   07/11/18 1505  BP: 110/80  Pulse: 94  Temp: 98.3 F (36.8 C)  SpO2: 98%     Physical Exam  Constitutional: He is oriented to person, place, and time. Vital signs are normal. He appears well-developed and well-nourished. He is active.  Non-toxic appearance. He does not have a sickly appearance.  HENT:  Head: Normocephalic.  Right Ear: Hearing, external ear and ear canal normal. Tympanic membrane is erythematous. A middle ear effusion is present.  Left Ear: Hearing, tympanic membrane, external ear and ear canal normal.  Nose: Mucosal edema and rhinorrhea present. Right sinus exhibits no maxillary sinus tenderness and no frontal sinus tenderness. Left sinus exhibits no maxillary sinus tenderness and no frontal sinus tenderness.  Mouth/Throat: Uvula is midline.  Posterior oropharyngeal erythema present.  Neck: Normal range of motion. Neck supple.  Cardiovascular: Normal rate, regular rhythm, normal heart sounds and normal pulses.  Pulmonary/Chest: Effort normal. He has rales in the right lower field and the left lower field.  Abdominal: Soft. Bowel sounds are normal.  Musculoskeletal: Normal range of motion.  Lymphadenopathy:       Head (right side): No submental and no submandibular adenopathy present.       Head (left side): No submental and no submandibular adenopathy present.    He has no cervical adenopathy.  Neurological: He is alert and oriented to person, place, and time.  Psychiatric: He has a normal mood and affect.  Vitals reviewed.  A: 1. Sinusitis, unspecified chronicity, unspecified location    P: Exam findings, diagnosis etiology and medication use and indications reviewed with patient. Follow- Up and discharge instructions provided. No emergent/urgent issues found on exam.  Patient verbalized understanding of information provided and agrees with plan of care (POC), all questions answered.  1. Sinusitis, unspecified chronicity, unspecified location - amoxicillin-clavulanate (AUGMENTIN) 875-125 MG tablet; Take 1 tablet by mouth 2 (two) times daily for 7 days. - dextromethorphan-guaiFENesin (MUCINEX DM) 30-600 MG 12hr tablet; Take 2 tablets by mouth 2 (two) times daily for 4 days. - benzonatate (TESSALON) 200 MG capsule; Take 1 capsule (200 mg total) by mouth 3 (three) times daily as needed for cough (with full glass of water).

## 2018-07-13 ENCOUNTER — Telehealth: Payer: Self-pay

## 2018-07-17 NOTE — Telephone Encounter (Signed)
Follow up call

## 2018-07-23 ENCOUNTER — Ambulatory Visit (INDEPENDENT_AMBULATORY_CARE_PROVIDER_SITE_OTHER): Payer: Self-pay | Admitting: Family Medicine

## 2018-07-23 VITALS — BP 110/80 | HR 71 | Temp 98.2°F | Resp 18 | Wt 280.2 lb

## 2018-07-23 DIAGNOSIS — J40 Bronchitis, not specified as acute or chronic: Secondary | ICD-10-CM

## 2018-07-23 DIAGNOSIS — R062 Wheezing: Secondary | ICD-10-CM

## 2018-07-23 DIAGNOSIS — J0100 Acute maxillary sinusitis, unspecified: Secondary | ICD-10-CM

## 2018-07-23 MED ORDER — DOXYCYCLINE HYCLATE 100 MG PO TABS: 100.0000 mg | ORAL_TABLET | Freq: Two times a day (BID) | ORAL | 0 refills | Status: DC

## 2018-07-23 MED ORDER — PREDNISONE 20 MG PO TABS: 40.0000 mg | ORAL_TABLET | Freq: Every day | ORAL | 0 refills | Status: AC

## 2018-07-23 NOTE — Patient Instructions (Signed)

## 2018-07-23 NOTE — Progress Notes (Signed)
Micheal Nelson is a 47 y.o. male who presents today with concerns of persistent symptoms of cough and congestion after completing 7 days of antibiotic treatment and not really experiencing any relief during the course of treatment. Patient does report use of over the counter medications for symptom relief of cough and nasal congestion and that this type of therapy/treatment provides symptom relief. Patient reports headache and general feelings of being unwell persist. He has reported recent travel but denies any change in symptom type or known contact with people with similar or more severe sick symptoms.  Review of Systems  Constitutional: Positive for chills, fever and malaise/fatigue.  HENT: Positive for congestion and sinus pain. Negative for ear discharge, ear pain and sore throat.   Eyes: Negative.   Respiratory: Positive for cough. Negative for sputum production and shortness of breath.   Cardiovascular: Negative.  Negative for chest pain.  Gastrointestinal: Negative for abdominal pain, diarrhea, nausea and vomiting.  Genitourinary: Negative for dysuria, frequency, hematuria and urgency.  Musculoskeletal: Negative for myalgias.  Skin: Negative.   Neurological: Positive for headaches.  Endo/Heme/Allergies: Negative.   Psychiatric/Behavioral: Negative.     O: Vitals:   07/23/18 1211  BP: 110/80  Pulse: 71  Resp: 18  Temp: 98.2 F (36.8 C)  SpO2: 96%     Physical Exam  Constitutional: He is oriented to person, place, and time. Vital signs are normal. He appears well-developed and well-nourished. He is active.  Non-toxic appearance. He does not have a sickly appearance.  HENT:  Head: Normocephalic.  Right Ear: Hearing, external ear and ear canal normal. A middle ear effusion is present.  Left Ear: Hearing, external ear and ear canal normal. A middle ear effusion is present.  Nose: Rhinorrhea present. Right sinus exhibits maxillary sinus tenderness and frontal sinus  tenderness. Left sinus exhibits maxillary sinus tenderness and frontal sinus tenderness.  Mouth/Throat: Uvula is midline and oropharynx is clear and moist.  Neck: Normal range of motion. Neck supple.  Cardiovascular: Normal rate, regular rhythm, normal heart sounds and normal pulses.  Pulmonary/Chest: Effort normal. No respiratory distress. He has wheezes in the right upper field, the right middle field, the right lower field, the left upper field, the left middle field and the left lower field. He has rhonchi in the right upper field, the right lower field, the left upper field and the left lower field.  Abdominal: Soft. Bowel sounds are normal.  Musculoskeletal: Normal range of motion.  Lymphadenopathy:       Head (right side): No submental and no submandibular adenopathy present.       Head (left side): No submental and no submandibular adenopathy present.    He has cervical adenopathy.  Neurological: He is alert and oriented to person, place, and time.  Psychiatric: He has a normal mood and affect.  Vitals reviewed.  A: 1. Acute non-recurrent maxillary sinusitis   2. Bronchitis   3. Wheezing      P: Exam findings, diagnosis etiology and medication use and indications reviewed with patient. Follow- Up and discharge instructions provided. No emergent/urgent issues found on exam.  Patient verbalized understanding of information provided and agrees with plan of care (POC), all questions answered.  1. Acute non-recurrent maxillary sinusitis - doxycycline (VIBRA-TABS) 100 MG tablet; Take 1 tablet (100 mg total) by mouth 2 (two) times daily.  Concern for treatment failure will change antibiotic class and treat for 10 day duration- will manage symptoms with OTC mucinex-DM and increased hydration and balanced  diet. Discussed with patient the need for follow up if  No improvement in 72 hours will give courtesy call at 48 hour mark to assess progress. Doxy is sufficient to cover multiple  pathogens for sinusitis and CAP discussed the treatment thought process with that patient.  2. Bronchitis - predniSONE (DELTASONE) 20 MG tablet; Take 2 tablets (40 mg total) by mouth daily with breakfast for 5 days.  3. Wheezing - predniSONE (DELTASONE) 20 MG tablet; Take 2 tablets (40 mg total) by mouth daily with breakfast for 5 days.  Other orders - phentermine (ADIPEX-P) 37.5 MG tablet; Take 37.5 mg by mouth daily.

## 2018-07-25 ENCOUNTER — Telehealth: Payer: Self-pay

## 2018-07-25 NOTE — Telephone Encounter (Signed)
Patient states he is doing good and he is doing everything as provider said.

## 2018-07-28 DIAGNOSIS — Z1389 Encounter for screening for other disorder: Secondary | ICD-10-CM | POA: Diagnosis not present

## 2018-07-28 DIAGNOSIS — M5442 Lumbago with sciatica, left side: Secondary | ICD-10-CM | POA: Diagnosis not present

## 2018-07-28 DIAGNOSIS — M545 Low back pain: Secondary | ICD-10-CM | POA: Diagnosis not present

## 2018-07-28 DIAGNOSIS — Z6841 Body Mass Index (BMI) 40.0 and over, adult: Secondary | ICD-10-CM | POA: Diagnosis not present

## 2018-08-18 MED FILL — PHENTERMINE 37.5 MG TABLET: 37.5 | 30 days supply | Qty: 30 | Fill #0

## 2019-03-01 ENCOUNTER — Other Ambulatory Visit: Payer: Self-pay | Admitting: Podiatry

## 2019-03-01 ENCOUNTER — Other Ambulatory Visit: Payer: Self-pay

## 2019-03-01 ENCOUNTER — Ambulatory Visit (INDEPENDENT_AMBULATORY_CARE_PROVIDER_SITE_OTHER): Payer: 59

## 2019-03-01 ENCOUNTER — Ambulatory Visit: Payer: 59 | Admitting: Podiatry

## 2019-03-01 ENCOUNTER — Encounter: Payer: Self-pay | Admitting: Podiatry

## 2019-03-01 VITALS — BP 121/86 | Temp 97.3°F

## 2019-03-01 DIAGNOSIS — M79671 Pain in right foot: Secondary | ICD-10-CM | POA: Diagnosis not present

## 2019-03-01 DIAGNOSIS — M722 Plantar fascial fibromatosis: Secondary | ICD-10-CM | POA: Diagnosis not present

## 2019-03-01 DIAGNOSIS — B070 Plantar wart: Secondary | ICD-10-CM

## 2019-03-01 MED ORDER — DICLOFENAC SODIUM 75 MG PO TBEC: 75.0000 mg | DELAYED_RELEASE_TABLET | Freq: Two times a day (BID) | ORAL | 2 refills | Status: DC

## 2019-03-01 MED ORDER — TRIAMCINOLONE ACETONIDE 10 MG/ML IJ SUSP
10.0000 mg | Freq: Once | INTRAMUSCULAR | Status: AC
  Administered 2019-03-01: 09:00:00 10 mg

## 2019-03-01 NOTE — Patient Instructions (Signed)

## 2019-03-01 NOTE — Progress Notes (Signed)
Subjective:   Patient ID: Micheal Nelson, male   DOB: 48 y.o.   MRN: [id number]   HPI Patient states is been having a lot of pain in the bottom of the right heel for at least a month and does not remember specific injury or cause.  States that he has not had this in the past and patient does not smoke and likes to be active   Review of Systems  All other systems reviewed and are negative.       Objective:  Physical Exam Vitals signs and nursing note reviewed.  Constitutional:      Appearance: He is well-developed.  Pulmonary:     Effort: Pulmonary effort is normal.  Musculoskeletal: Normal range of motion.  Skin:    General: Skin is warm.  Neurological:     Mental Status: He is alert.     Neurovascular status intact muscle strength was adequate range of motion was within normal limits.  Patient has mild equinus condition and is noted to have exquisite discomfort plantar aspect right heel at the insertional point of the tendon into the calcaneus with inflammation fluid buildup noted.  Also had small lesions on the right inside of the heel which also could be contributory to the pain the patient is experiencing     Assessment:  2 separate problems with one being plantar fascial inflammation of the heel and the second being probable verruca plantaris formation which may be contributory to the pain the patient is experiencing     Plan:  H&P x-rays reviewed with patient and today I did do sterile prep and injected the fascia 3 mg Kenalog 5 mg Xylocaine and applied fascial brace.  Discussed possible treatment of the wart depending on response and it may be associated with a primary pain the patient is experiencing.  Reappoint to recheck with all physical therapy and instructions for shoe gear modifications  X-rays indicate the moderate elevation of the arch with no indication of stress fracture no indication of spur formation

## 2019-03-15 ENCOUNTER — Ambulatory Visit: Payer: 59 | Admitting: Podiatry

## 2019-03-16 ENCOUNTER — Other Ambulatory Visit: Payer: Self-pay

## 2019-03-16 ENCOUNTER — Encounter: Payer: Self-pay | Admitting: Podiatry

## 2019-03-16 ENCOUNTER — Ambulatory Visit: Payer: 59 | Admitting: Podiatry

## 2019-03-16 VITALS — Temp 97.3°F

## 2019-03-16 DIAGNOSIS — B070 Plantar wart: Secondary | ICD-10-CM | POA: Diagnosis not present

## 2019-03-16 DIAGNOSIS — M722 Plantar fascial fibromatosis: Secondary | ICD-10-CM

## 2019-03-16 MED ORDER — TRIAMCINOLONE ACETONIDE 10 MG/ML IJ SUSP
10.0000 mg | Freq: Once | INTRAMUSCULAR | Status: AC
  Administered 2019-03-16: 10 mg

## 2019-03-17 NOTE — Progress Notes (Signed)
Subjective:   Patient ID: Micheal Nelson, male   DOB: 48 y.o.   MRN: [id number]   HPI Patient states he is improved but is still having quite a bit of discomfort in the plantar aspect of the left heel   ROS      Objective:  Physical Exam  Neurovascular status intact with patient's left heel still being tender at the medial band with moderate improvement and also noted to have lesions on the left medial heel which do bother him and he would like treated     Assessment:  Continued acute plantar fasciitis left with several lesions plantar medial aspect left heel that upon debridement shows pinpoint bleeding     Plan:  Reviewed both conditions and today I did sterile debridement of the lesions and applied chemical agent consisting of immune agent to try to kill wart tissue and explained what to do if any blistering were to occur.  I went ahead and injected the left plantar fascia after sterile prep 3 mg Kenalog 5 mg Xylocaine and applied sterile dressing.  Gave instructions to return depending on symptoms

## 2019-06-07 ENCOUNTER — Other Ambulatory Visit: Payer: Self-pay

## 2019-06-07 DIAGNOSIS — R6889 Other general symptoms and signs: Secondary | ICD-10-CM | POA: Diagnosis not present

## 2019-06-07 DIAGNOSIS — Z20822 Contact with and (suspected) exposure to covid-19: Secondary | ICD-10-CM

## 2019-06-08 LAB — NOVEL CORONAVIRUS, NAA: SARS-CoV-2, NAA: NOT DETECTED

## 2019-10-20 ENCOUNTER — Other Ambulatory Visit: Payer: Self-pay

## 2019-10-20 ENCOUNTER — Ambulatory Visit
Admission: EM | Admit: 2019-10-20 | Discharge: 2019-10-20 | Disposition: A | Discharge status: Home / Self Care | Payer: 59

## 2019-10-20 DIAGNOSIS — Z20822 Contact with and (suspected) exposure to covid-19: Secondary | ICD-10-CM

## 2019-10-20 DIAGNOSIS — M791 Myalgia, unspecified site: Secondary | ICD-10-CM | POA: Diagnosis not present

## 2019-10-20 DIAGNOSIS — R05 Cough: Secondary | ICD-10-CM

## 2019-10-20 DIAGNOSIS — R519 Headache, unspecified: Secondary | ICD-10-CM

## 2019-10-20 DIAGNOSIS — Z20828 Contact with and (suspected) exposure to other viral communicable diseases: Secondary | ICD-10-CM | POA: Diagnosis not present

## 2019-10-20 MED ORDER — FLUTICASONE PROPIONATE 50 MCG/ACT NA SUSP: 1.0000 | Freq: Every day | NASAL | 0 refills | Status: DC

## 2019-10-20 MED ORDER — BENZONATATE 100 MG PO CAPS: 100.0000 mg | ORAL_CAPSULE | Freq: Three times a day (TID) | ORAL | 0 refills | Status: DC

## 2019-10-20 NOTE — ED Triage Notes (Signed)
Pt presents to UC w/ c/o cough, runny nose, headache, body aches yesterday. Pt states he has had positive covid exposure this week.

## 2019-10-20 NOTE — ED Provider Notes (Signed)
[facility]    CSN: [id number] Arrival date & time: 10/20/19  1219      History   Chief Complaint Chief Complaint  Patient presents with  . cough, runny nose, headache    HPI Micheal Nelson is a 49 y.o. male.   Micheal Nelson 49 years old male presented to the urgent care with a complaint of headaches, body aches, cough, runny nose that started yesterday.  Denies sick exposure to COVID, flu or strep.  Denies recent travel.  Denies aggravating or alleviating symptoms.  Denies previous COVID infection.   Denies fever, chills, fatigue, nasal congestion,  sore throat,  SOB, wheezing, chest pain, nausea, vomiting, changes in bowel or bladder habits.       History reviewed. No pertinent past medical history.  Patient Active Problem List   Diagnosis Date Noted  . Pain in joint of right ankle 03/07/2018    Past Surgical History:  Procedure Laterality Date  . HERNIA REPAIR         Home Medications    Prior to Admission medications   Medication Sig Start Date End Date Taking? Authorizing Provider  levocetirizine (XYZAL) 5 MG tablet Take 5 mg by mouth every evening.   Yes [provider]  benzonatate (TESSALON) 100 MG capsule Take 1 capsule (100 mg total) by mouth every 8 (eight) hours. 10/20/19   Michaelene Dutan, Zachery Dakins, FNP  cetirizine (ZYRTEC) 10 MG tablet Take 10 mg by mouth daily.    [provider]  diclofenac (VOLTAREN) 75 MG EC tablet Take 1 tablet (75 mg total) by mouth 2 (two) times daily. 03/01/19   Lenn Sink, DPM  fluticasone (FLONASE) 50 MCG/ACT nasal spray Place 1 spray into both nostrils daily for 14 days. 10/20/19 11/03/19  Durward Parcel, FNP    Family History Family History  Problem Relation Age of Onset  . Healthy Mother   . Healthy Father     Social History Social History   Tobacco Use  . Smoking status: Never Smoker  . Smokeless tobacco: Former Neurosurgeon    Types: Chew  Substance Use Topics  . Alcohol use: Never    . Drug use: Never     Allergies   Patient has no known allergies.   Review of Systems Review of Systems  Constitutional: Negative.   HENT: Positive for rhinorrhea.   Respiratory: Positive for cough.   Cardiovascular: Negative.   Gastrointestinal: Negative.   Musculoskeletal:       Bodyaches  Neurological: Positive for headaches.     Physical Exam Triage Vital Signs ED Triage Vitals  Enc Vitals Group     BP      Pulse      Resp      Temp      Temp src      SpO2      Weight      Height      Head Circumference      Peak Flow      Pain Score      Pain Loc      Pain Edu?      Excl. in GC?    No data found.  Updated Vital Signs There were no vitals taken for this visit.  Visual Acuity Right Eye Distance:   Left Eye Distance:   Bilateral Distance:    Right Eye Near:   Left Eye Near:    Bilateral Near:     Physical Exam Constitutional:  General: He is not in acute distress.    Appearance: Normal appearance. He is normal weight. He is not ill-appearing or toxic-appearing.  HENT:     Head: Normocephalic.     Right Ear: Tympanic membrane, ear canal and external ear normal. There is no impacted cerumen.     Left Ear: Tympanic membrane, ear canal and external ear normal. There is no impacted cerumen.     Nose: Nose normal. No congestion.     Mouth/Throat:     Mouth: Mucous membranes are moist.     Pharynx: No oropharyngeal exudate or posterior oropharyngeal erythema.  Cardiovascular:     Rate and Rhythm: Normal rate and regular rhythm.     Pulses: Normal pulses.     Heart sounds: Normal heart sounds. No murmur.  Pulmonary:     Effort: Pulmonary effort is normal. No respiratory distress.     Breath sounds: No wheezing or rhonchi.  Chest:     Chest wall: No tenderness.  Abdominal:     General: Abdomen is flat. Bowel sounds are normal. There is no distension.     Palpations: There is no mass.  Skin:    Capillary Refill: Capillary refill takes less  than 2 seconds.  Neurological:     Mental Status: He is alert and oriented to person, place, and time.      UC Treatments / Results  Labs (all labs ordered are listed, but only abnormal results are displayed) Labs Reviewed  NOVEL CORONAVIRUS, NAA    EKG   Radiology No results found.  Procedures Procedures (including critical care time)  Medications Ordered in UC Medications - No data to display  Initial Impression / Assessment and Plan / UC Course  I have reviewed the triage vital signs and the nursing notes.  Pertinent labs & imaging results that were available during my care of the patient were reviewed by me and considered in my medical decision making (see chart for details).   Patient stable for discharge.  Benign physical exam.  Advised patient to quarantine until COVID-19 test result become available.  To go to ED for worsening of symptoms.  Patient verbalized understanding of the plan of care.    Final Clinical Impressions(s) / UC Diagnoses   Final diagnoses:  Suspected COVID-19 virus infection     Discharge Instructions     COVID testing ordered.  It will take between 527 days for test results.  Someone will contact you regarding abnormal results.    In the meantime: You should remain isolated in your home for 10 days from symptom onset  Get plenty of rest and push fluids Tessalon Perles prescribed for cough Flonase prescribed for nasal congestion and runny nose Use medications daily for symptom relief Use OTC medications like ibuprofen or tylenol as needed fever or pain Call or go to the ED if you have any new or worsening symptoms such as fever, worsening cough, shortness of breath, chest tightness, chest pain, turning blue, changes in mental status, etc...     ED Prescriptions    Medication Sig Dispense Auth. Provider   benzonatate (TESSALON) 100 MG capsule Take 1 capsule (100 mg total) by mouth every 8 (eight) hours. 21 capsule Camree Wigington S,  FNP   fluticasone (FLONASE) 50 MCG/ACT nasal spray Place 1 spray into both nostrils daily for 14 days. 16 g Emerson Monte, FNP     PDMP not reviewed this encounter.   Emerson Monte, FNP 10/20/19 1406

## 2019-10-20 NOTE — Discharge Instructions (Signed)
COVID testing ordered.  It will take between 527 days for test results.  Someone will contact you regarding abnormal results.    In the meantime: You should remain isolated in your home for 10 days from symptom onset  Get plenty of rest and push fluids Tessalon Perles prescribed for cough Flonase prescribed for nasal congestion and runny nose Use medications daily for symptom relief Use OTC medications like ibuprofen or tylenol as needed fever or pain Call or go to the ED if you have any new or worsening symptoms such as fever, worsening cough, shortness of breath, chest tightness, chest pain, turning blue, changes in mental status, etc..Marland Kitchen

## 2019-10-21 LAB — NOVEL CORONAVIRUS, NAA: SARS-CoV-2, NAA: DETECTED — AB

## 2019-10-22 ENCOUNTER — Other Ambulatory Visit: Payer: 59

## 2019-10-23 ENCOUNTER — Telehealth: Payer: Self-pay | Admitting: Emergency Medicine

## 2019-10-23 NOTE — Telephone Encounter (Signed)

## 2019-12-05 DIAGNOSIS — Z0189 Encounter for other specified special examinations: Secondary | ICD-10-CM | POA: Diagnosis not present

## 2019-12-05 DIAGNOSIS — J302 Other seasonal allergic rhinitis: Secondary | ICD-10-CM | POA: Diagnosis not present

## 2019-12-05 DIAGNOSIS — L600 Ingrowing nail: Secondary | ICD-10-CM | POA: Diagnosis not present

## 2019-12-18 DIAGNOSIS — Z1321 Encounter for screening for nutritional disorder: Secondary | ICD-10-CM | POA: Diagnosis not present

## 2019-12-18 DIAGNOSIS — R7301 Impaired fasting glucose: Secondary | ICD-10-CM | POA: Diagnosis not present

## 2019-12-18 DIAGNOSIS — Z1329 Encounter for screening for other suspected endocrine disorder: Secondary | ICD-10-CM | POA: Diagnosis not present

## 2019-12-25 ENCOUNTER — Other Ambulatory Visit (HOSPITAL_COMMUNITY): Payer: Self-pay | Admitting: Nurse Practitioner

## 2019-12-25 DIAGNOSIS — J302 Other seasonal allergic rhinitis: Secondary | ICD-10-CM | POA: Diagnosis not present

## 2019-12-25 DIAGNOSIS — E559 Vitamin D deficiency, unspecified: Secondary | ICD-10-CM | POA: Diagnosis not present

## 2019-12-25 DIAGNOSIS — L600 Ingrowing nail: Secondary | ICD-10-CM | POA: Diagnosis not present

## 2019-12-25 DIAGNOSIS — E1165 Type 2 diabetes mellitus with hyperglycemia: Secondary | ICD-10-CM | POA: Diagnosis not present

## 2019-12-25 DIAGNOSIS — Z0001 Encounter for general adult medical examination with abnormal findings: Secondary | ICD-10-CM | POA: Diagnosis not present

## 2019-12-25 DIAGNOSIS — L209 Atopic dermatitis, unspecified: Secondary | ICD-10-CM | POA: Diagnosis not present

## 2019-12-25 DIAGNOSIS — E782 Mixed hyperlipidemia: Secondary | ICD-10-CM | POA: Diagnosis not present

## 2019-12-25 MED FILL — FREESTYLE LITE TEST STRIP: 50 days supply | Qty: 100 | Fill #0

## 2019-12-25 MED FILL — FARXIGA 10 MG TABLET: 10 | 90 days supply | Qty: 90 | Fill #0

## 2019-12-25 MED FILL — TRIAMCINOLONE 0.1% CREAM: 0.1 | 5 days supply | Qty: 15 | Fill #0

## 2019-12-25 MED FILL — VASCEPA 1 GM CAPSULE: 1 | 90 days supply | Qty: 360 | Fill #0

## 2019-12-25 MED FILL — metFORMIN HCL 500 MG TABS: 500 | 90 days supply | Qty: 180 | Fill #0

## 2019-12-25 MED FILL — ATORVASTATIN 40 MG TABLET: 40 | 90 days supply | Qty: 90 | Fill #0

## 2019-12-25 MED FILL — FREESTYLE LITE METER: 1 days supply | Qty: 1 | Fill #0

## 2019-12-25 MED FILL — FREESTYLE LANCETS: 50 days supply | Qty: 100 | Fill #0

## 2020-01-18 ENCOUNTER — Ambulatory Visit: Payer: 59 | Attending: Internal Medicine

## 2020-01-18 DIAGNOSIS — Z23 Encounter for immunization: Secondary | ICD-10-CM

## 2020-01-18 NOTE — Progress Notes (Signed)
   Covid-19 Vaccination Clinic  Name:  Micheal Nelson    MRN: [id number] DOB: 1971-08-24  01/18/2020  Mr. Joslin was observed post Covid-19 immunization for 15 minutes without incident. He was provided with Vaccine Information Sheet and instruction to access the V-Safe system.   Mr. Kaufhold was instructed to call 911 with any severe reactions post vaccine: Marland Kitchen Difficulty breathing  . Swelling of face and throat  . A fast heartbeat  . A bad rash all over body  . Dizziness and weakness   Immunizations Administered    Name Date Dose VIS Date Route   Moderna COVID-19 Vaccine 01/18/2020  8:58 AM 0.5 mL 09/18/2019 Intramuscular   Manufacturer: Moderna   Lot: [id number]   NDC: 92004-159-30

## 2020-01-24 DIAGNOSIS — J3489 Other specified disorders of nose and nasal sinuses: Secondary | ICD-10-CM | POA: Diagnosis not present

## 2020-01-24 DIAGNOSIS — E1165 Type 2 diabetes mellitus with hyperglycemia: Secondary | ICD-10-CM | POA: Diagnosis not present

## 2020-01-24 DIAGNOSIS — E782 Mixed hyperlipidemia: Secondary | ICD-10-CM | POA: Diagnosis not present

## 2020-02-04 DIAGNOSIS — L600 Ingrowing nail: Secondary | ICD-10-CM | POA: Diagnosis not present

## 2020-02-04 DIAGNOSIS — M79674 Pain in right toe(s): Secondary | ICD-10-CM | POA: Diagnosis not present

## 2020-02-19 ENCOUNTER — Ambulatory Visit: Payer: 59 | Attending: Internal Medicine

## 2020-02-19 DIAGNOSIS — Z23 Encounter for immunization: Secondary | ICD-10-CM

## 2020-02-19 NOTE — Progress Notes (Signed)
   Covid-19 Vaccination Clinic  Name:  ESCHER HARR    MRN: [id number] DOB: 1971-01-11  02/19/2020  Mr. Sassano was observed post Covid-19 immunization for 15 minutes without incident. He was provided with Vaccine Information Sheet and instruction to access the V-Safe system.   Mr. Paye was instructed to call 911 with any severe reactions post vaccine: Marland Kitchen Difficulty breathing  . Swelling of face and throat  . A fast heartbeat  . A bad rash all over body  . Dizziness and weakness   Immunizations Administered    Name Date Dose VIS Date Route   Moderna COVID-19 Vaccine 02/19/2020  8:25 AM 0.5 mL 09/2019 Intramuscular   Manufacturer: Moderna   Lot: [id number]   NDC: 78676-720-94

## 2020-04-01 DIAGNOSIS — Z0000 Encounter for general adult medical examination without abnormal findings: Secondary | ICD-10-CM | POA: Diagnosis not present

## 2020-04-01 DIAGNOSIS — E1169 Type 2 diabetes mellitus with other specified complication: Secondary | ICD-10-CM | POA: Diagnosis not present

## 2020-04-01 DIAGNOSIS — E785 Hyperlipidemia, unspecified: Secondary | ICD-10-CM | POA: Diagnosis not present

## 2020-04-03 DIAGNOSIS — E782 Mixed hyperlipidemia: Secondary | ICD-10-CM | POA: Diagnosis not present

## 2020-04-03 DIAGNOSIS — L600 Ingrowing nail: Secondary | ICD-10-CM | POA: Diagnosis not present

## 2020-04-03 DIAGNOSIS — J302 Other seasonal allergic rhinitis: Secondary | ICD-10-CM | POA: Diagnosis not present

## 2020-04-03 DIAGNOSIS — E1165 Type 2 diabetes mellitus with hyperglycemia: Secondary | ICD-10-CM | POA: Diagnosis not present

## 2020-04-03 DIAGNOSIS — L209 Atopic dermatitis, unspecified: Secondary | ICD-10-CM | POA: Diagnosis not present

## 2020-04-03 DIAGNOSIS — Z3002 Counseling and instruction in natural family planning to avoid pregnancy: Secondary | ICD-10-CM | POA: Diagnosis not present

## 2020-04-03 DIAGNOSIS — E559 Vitamin D deficiency, unspecified: Secondary | ICD-10-CM | POA: Diagnosis not present

## 2020-06-02 MED FILL — LISINOPRIL 2.5 MG TABLET: 2.5 | 90 days supply | Qty: 90 | Fill #0

## 2020-06-03 ENCOUNTER — Ambulatory Visit: Payer: 59 | Admitting: Urology

## 2020-06-17 ENCOUNTER — Encounter: Payer: Self-pay | Admitting: Urology

## 2020-06-17 ENCOUNTER — Other Ambulatory Visit: Payer: Self-pay

## 2020-06-17 ENCOUNTER — Ambulatory Visit (INDEPENDENT_AMBULATORY_CARE_PROVIDER_SITE_OTHER): Payer: 59 | Admitting: Urology

## 2020-06-17 VITALS — BP 111/74 | HR 72 | Temp 98.0°F | Ht 71.0 in | Wt 265.0 lb

## 2020-06-17 DIAGNOSIS — Z3009 Encounter for other general counseling and advice on contraception: Secondary | ICD-10-CM

## 2020-06-17 MED FILL — METFORMIN HCL 500 MG TABS: 500 | 90 days supply | Qty: 180 | Fill #0

## 2020-06-17 MED FILL — VASCEPA 1 GM CAPSULE: 1 | 90 days supply | Qty: 360 | Fill #0

## 2020-06-17 MED FILL — ATORVASTATIN 40 MG TABLET: 40 | 90 days supply | Qty: 90 | Fill #0

## 2020-06-17 MED FILL — FARXIGA 10 MG TABLET: 10 | 90 days supply | Qty: 90 | Fill #0

## 2020-06-17 NOTE — Consult Note (Signed)
H&P  Chief Complaint: Vasectomy consultation  History of Present Illness: 49 year old male presents at this time for consideration of vasectomy/consultation.  Wife--Amy Children--4 children-ages 9-21.  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  Past Surgical History:  Procedure Laterality Date  . HERNIA REPAIR      Home Medications:  Allergies as of 06/17/2020   No Known Allergies     Medication List       Accurate as of June 17, 2020  1:00 PM. If you have any questions, ask your nurse or doctor.        benzonatate 100 MG capsule Commonly known as: TESSALON Take 1 capsule (100 mg total) by mouth every 8 (eight) hours.   cetirizine 10 MG tablet Commonly known as: ZYRTEC Take 10 mg by mouth daily.   diclofenac 75 MG EC tablet Commonly known as: VOLTAREN Take 1 tablet (75 mg total) by mouth 2 (two) times daily.   fluticasone 50 MCG/ACT nasal spray Commonly known as: FLONASE Place 1 spray into both nostrils daily for 14 days.   levocetirizine 5 MG tablet Commonly known as: XYZAL Take 5 mg by mouth every evening.       Allergies: No Known Allergies  Family History  Problem Relation Age of Onset  . Healthy Mother   . Healthy Father     Social History:  reports that he has never smoked. He quit smokeless tobacco use about 13 years ago.  His smokeless tobacco use included chew. He reports that he does not drink alcohol and does not use drugs.  ROS: A complete review of systems was performed.  All systems are negative except for pertinent findings as noted.  Physical Exam:  Vital signs in last 24 hours: There were no vitals taken for this visit. Constitutional:  Alert and oriented, No acute distress Cardiovascular: Regular rate  Genitourinary: Normal male phallus, testes are descended bilaterally and non-tender and without masses, scrotum is normal in appearance without  lesions or masses, perineum is normal on inspection. Lymphatic: No lymphadenopathy Neurologic: Grossly intact, no focal deficits Psychiatric: Normal mood and affect   Radiologic Imaging: No results found.  Impression/Assessment:  Desire for sterility  Plan: 1. We discussed the procedure in detail, and he was provided a vasectomy information packet. He understands that this is a permanent form of sterilization  2. We discussed that vasectomy does not produce immediate infertility and he will need at least one semen sample showing no sperm before he can discontinue contraception. This typically takes about 3 months  3. We discussed that there is a less than 1 in 1000 failure rate  4. I advised him to refrain from intercourse for approximately 1 week  5. There is less than a 1% chance of a postop scrotal hematoma or chronic scrotal pain  6. We will schedule vasectomy at his convenience

## 2020-06-17 NOTE — Progress Notes (Signed)

## 2020-07-10 DIAGNOSIS — Z0000 Encounter for general adult medical examination without abnormal findings: Secondary | ICD-10-CM | POA: Diagnosis not present

## 2020-07-15 ENCOUNTER — Other Ambulatory Visit (HOSPITAL_COMMUNITY): Payer: Self-pay | Admitting: Nurse Practitioner

## 2020-07-15 DIAGNOSIS — E559 Vitamin D deficiency, unspecified: Secondary | ICD-10-CM | POA: Diagnosis not present

## 2020-07-15 DIAGNOSIS — Z23 Encounter for immunization: Secondary | ICD-10-CM | POA: Diagnosis not present

## 2020-07-15 DIAGNOSIS — L209 Atopic dermatitis, unspecified: Secondary | ICD-10-CM | POA: Diagnosis not present

## 2020-07-15 DIAGNOSIS — J302 Other seasonal allergic rhinitis: Secondary | ICD-10-CM | POA: Diagnosis not present

## 2020-07-15 DIAGNOSIS — E1165 Type 2 diabetes mellitus with hyperglycemia: Secondary | ICD-10-CM | POA: Diagnosis not present

## 2020-07-15 DIAGNOSIS — Z3002 Counseling and instruction in natural family planning to avoid pregnancy: Secondary | ICD-10-CM | POA: Diagnosis not present

## 2020-07-15 DIAGNOSIS — E782 Mixed hyperlipidemia: Secondary | ICD-10-CM | POA: Diagnosis not present

## 2020-07-15 MED FILL — VIT D3-50 50,000 UNITS CAPS: 1.25 MG | 56 days supply | Qty: 8 | Fill #0

## 2020-07-15 MED FILL — RYBELSUS 7 MG TABS: 7 | 90 days supply | Qty: 90 | Fill #0

## 2020-07-22 MED FILL — RYBELSUS 7 MG TABS: 7 | 90 days supply | Qty: 90 | Fill #0

## 2020-08-05 ENCOUNTER — Encounter: Payer: Self-pay | Admitting: Urology

## 2020-08-05 ENCOUNTER — Other Ambulatory Visit: Payer: Self-pay

## 2020-08-05 ENCOUNTER — Ambulatory Visit (INDEPENDENT_AMBULATORY_CARE_PROVIDER_SITE_OTHER): Payer: 59 | Admitting: Urology

## 2020-08-05 VITALS — BP 113/76 | HR 72 | Temp 98.5°F | Ht 71.0 in | Wt 265.0 lb

## 2020-08-05 DIAGNOSIS — N3943 Post-void dribbling: Secondary | ICD-10-CM

## 2020-08-05 DIAGNOSIS — Z302 Encounter for sterilization: Secondary | ICD-10-CM | POA: Diagnosis not present

## 2020-08-05 DIAGNOSIS — Z9852 Vasectomy status: Secondary | ICD-10-CM

## 2020-08-05 NOTE — Addendum Note (Signed)
Addended by: Dalia Heading on: 08/05/2020 10:34 AM   Modules accepted: Orders

## 2020-08-05 NOTE — Patient Instructions (Signed)
Vasectomy Postoperative Instructions  Please bring back a semen analysis in approximately 3 months.  Your semen analysis  will need to be taken to  [facility] [address] [place], [place] [address] There phone number is [phone]. Please call and schedule an appointment before taking your specimen.    You will be given a sterile specimen cup. Please label the cup with your name, date of birth, date and time of collections.  What to Expect  - slight redness, swelling and scant drainage along the incision  - mild to moderate discomfort  - black and blue (bruising) as the tissue heals  - low grade fever  - scrotal sensitivity and/or tenderness - Edges of the incision may pull apart and heal slowly, sometimes a knot may be present which remains for several months.  This is NORMAL and all part of the healing process. - if stitches are placed, they do not need to be removed - if you have pain or discomfort immediately after the vasectomy, you may use OTC pain medication for relief , ex: tylenol.  After local anesthetic wears off an ice pack will provide additional comfort and can also prevent swelling if used  Activity  - no sexual intercourse for at lease 5 days depending on comfort  - no heavy lifting for 48-72 hours (anything over 5-10 lbs)  Wound Care  - shower only after 24 hours  - no tub baths, hot tub, or pools for at least 7 days  - ice packs for 48 hours: 30 minutes on and 30 minutes off  Problem to Report  - generalized redness  - increased pain and swelling  - fever greater than 101 F  - significant drainage or bleeding from the wound  TO DO - Ejaculations help to clear the passage of sperm, but you must use another from of birth control until you are told you may discontinue its use!! - You will be given a specimen cup to bring back a semen sample in 3 months to check and see if its clear of sperm.  Only after the semen is sent for analysis and is reported back as clear  should you use this as your primary form of birth control!   

## 2020-08-05 NOTE — Progress Notes (Signed)
Urological Symptom Review  Patient is experiencing the following symptoms:    Review of Systems None Gastrointestinal (upper)  : Negative for upper GI symptoms  Gastrointestinal (lower) : Negative for lower GI symptoms  Constitutional : Negative for symptoms  Skin: Negative for skin symptoms  Eyes: Negative for eye symptoms  Ear/Nose/Throat : Negative for Ear/Nose/Throat symptoms  Hematologic/Lymphatic: Negative for Hematologic/Lymphatic symptoms  Cardiovascular : Negative for cardiovascular symptoms  Respiratory : Negative for respiratory symptoms  Endocrine: Negative for endocrine symptoms  Musculoskeletal: Negative for musculoskeletal symptoms  Neurological: Negative for neurological symptoms  Psychologic: Negative for psychiatric symptoms

## 2020-08-05 NOTE — Progress Notes (Signed)
The patient has reviewed information regarding the risks and complications and understands the advantages and disadvantages of elective sterilization. With full informed written and oral consent, he has elected to proceed with vasectomy. The patient was placed in the supine position, and the genitalia was sterilely prepped with betadine and draped; 1% lidocaine was used for local anesthesia of the skin and perivasal tissue in the midline. The right vas was then grasped through the skin using the non-piercing vas clamp, the skin was pierced with a single blade of the sharpened hemostat. The sharpened hemostat was then placed through this incision, and the perivasal tissue cleared from around the vas itself. The vas was then delivered through the wound and clamped with a vas clamp, a segment of approximately 1 cm of vasal tissue was cleared, and a 2-0 chromic suture was then passed beneath the vas. The chromic suture was then tied proximally, and a second one was tied distally, isolating the approximately 1 cm segment of vas. I then excised the segment of vas and cauterized the proximal and distal lumens with the eye cautery. The excess suture was then cut, and the ligated ends were covered with dartos fascia and buried, and the vas was allowed to drop back into the scrotum after observation for any bleeding. The contralateral vas was then treated in an identical fashion. Any further bleeding points were cauterized. The wound was then observed for any bleeding, the skin edges compressed, and none was noted. The patient tolerated the procedure well and without complications.   A sterile gauze dressing was applied as well as a form of scrotal supporting clothing. He was sent home with instructions regarding wound care and signs of infection, and recommended to restrict activity for 48 hours. .  He was instructed to use ice for the next 12 hours.  He was also instructed to call immediately for excessive bleeding,  swelling, drainage, discomfort, fever or chills. He was informed he should not consider himself sterile until a  semen analysis at 12 weeks has  been noted to be completely free of sperm.  

## 2020-09-04 ENCOUNTER — Other Ambulatory Visit (HOSPITAL_COMMUNITY): Payer: Self-pay | Admitting: Internal Medicine

## 2020-09-04 MED FILL — LISINOPRIL 2.5 MG TABLET: 2.5 | 30 days supply | Qty: 30 | Fill #1

## 2020-09-05 ENCOUNTER — Other Ambulatory Visit (HOSPITAL_COMMUNITY): Payer: Self-pay | Admitting: Nurse Practitioner

## 2020-09-05 MED FILL — METFORMIN HCL 500 MG TABS: 500 | 90 days supply | Qty: 180 | Fill #0

## 2020-09-05 MED FILL — VIT D3-50 50,000 UNITS CAPS: 1.25 MG | 56 days supply | Qty: 8 | Fill #0

## 2020-09-05 MED FILL — FARXIGA 10 MG TABLET: 10 | 90 days supply | Qty: 90 | Fill #0

## 2020-09-05 MED FILL — VASCEPA 1 GM CAPSULE: 1 | 90 days supply | Qty: 360 | Fill #0

## 2020-09-05 MED FILL — ATORVASTATIN 40 MG TABLET: 40 | 90 days supply | Qty: 90 | Fill #0

## 2020-10-13 ENCOUNTER — Other Ambulatory Visit (HOSPITAL_COMMUNITY): Payer: Self-pay | Admitting: Nurse Practitioner

## 2020-10-13 MED FILL — LISINOPRIL 2.5 MG TABLET: 2.5 | 30 days supply | Qty: 30 | Fill #0

## 2020-10-14 ENCOUNTER — Ambulatory Visit: Payer: 59 | Admitting: Urology

## 2020-10-23 DIAGNOSIS — E559 Vitamin D deficiency, unspecified: Secondary | ICD-10-CM | POA: Diagnosis not present

## 2020-10-23 DIAGNOSIS — E782 Mixed hyperlipidemia: Secondary | ICD-10-CM | POA: Diagnosis not present

## 2020-10-23 DIAGNOSIS — L209 Atopic dermatitis, unspecified: Secondary | ICD-10-CM | POA: Diagnosis not present

## 2020-10-23 DIAGNOSIS — Z23 Encounter for immunization: Secondary | ICD-10-CM | POA: Diagnosis not present

## 2020-10-23 DIAGNOSIS — E1165 Type 2 diabetes mellitus with hyperglycemia: Secondary | ICD-10-CM | POA: Diagnosis not present

## 2020-10-23 DIAGNOSIS — Z3002 Counseling and instruction in natural family planning to avoid pregnancy: Secondary | ICD-10-CM | POA: Diagnosis not present

## 2020-10-23 DIAGNOSIS — J302 Other seasonal allergic rhinitis: Secondary | ICD-10-CM | POA: Diagnosis not present

## 2020-10-23 DIAGNOSIS — Z712 Person consulting for explanation of examination or test findings: Secondary | ICD-10-CM | POA: Diagnosis not present

## 2020-10-23 LAB — LIPID PANEL
LDL Cholesterol: 74
Triglycerides: 185 — AB (ref 40–160)

## 2020-10-23 LAB — HEMOGLOBIN A1C: Hemoglobin A1C: 7.2

## 2020-10-23 LAB — CBC AND DIFFERENTIAL: Hemoglobin: 16.5 (ref 13.5–17.5)

## 2020-10-23 LAB — BASIC METABOLIC PANEL
BUN: 18 (ref 4–21)
Creatinine: 0.8 (ref 0.6–1.3)
Glucose: 141

## 2020-10-23 LAB — VITAMIN D 25 HYDROXY (VIT D DEFICIENCY, FRACTURES): Vit D, 25-Hydroxy: 24.2

## 2020-10-23 LAB — COMPREHENSIVE METABOLIC PANEL: GFR calc non Af Amer: 109

## 2020-10-23 LAB — MICROALBUMIN, URINE: Microalb, Ur: 5

## 2020-10-28 ENCOUNTER — Other Ambulatory Visit (HOSPITAL_COMMUNITY): Payer: Self-pay | Admitting: Internal Medicine

## 2020-10-28 DIAGNOSIS — L209 Atopic dermatitis, unspecified: Secondary | ICD-10-CM | POA: Diagnosis not present

## 2020-10-28 DIAGNOSIS — E559 Vitamin D deficiency, unspecified: Secondary | ICD-10-CM | POA: Diagnosis not present

## 2020-10-28 DIAGNOSIS — E782 Mixed hyperlipidemia: Secondary | ICD-10-CM | POA: Diagnosis not present

## 2020-10-28 DIAGNOSIS — Z3002 Counseling and instruction in natural family planning to avoid pregnancy: Secondary | ICD-10-CM | POA: Diagnosis not present

## 2020-10-28 DIAGNOSIS — J302 Other seasonal allergic rhinitis: Secondary | ICD-10-CM | POA: Diagnosis not present

## 2020-10-28 DIAGNOSIS — E1165 Type 2 diabetes mellitus with hyperglycemia: Secondary | ICD-10-CM | POA: Diagnosis not present

## 2020-10-28 MED FILL — RYBELSUS 14 MG TABS: 14 | 90 days supply | Qty: 90 | Fill #0

## 2020-11-19 MED FILL — LISINOPRIL 2.5 MG TABLET: 2.5 | 30 days supply | Qty: 30 | Fill #1

## 2020-12-16 ENCOUNTER — Other Ambulatory Visit (HOSPITAL_COMMUNITY): Payer: Self-pay | Admitting: Internal Medicine

## 2020-12-16 MED FILL — VASCEPA 1 GM CAPSULE: 1 | 90 days supply | Qty: 360 | Fill #0

## 2020-12-17 MED FILL — ATORVASTATIN 40 MG TABLET: 40 | 90 days supply | Qty: 90 | Fill #0

## 2020-12-17 MED FILL — METFORMIN HCL 500 MG TABS: 500 | 90 days supply | Qty: 180 | Fill #0

## 2020-12-17 MED FILL — FARXIGA 10 MG TABLET: 10 | 90 days supply | Qty: 90 | Fill #0

## 2020-12-23 ENCOUNTER — Other Ambulatory Visit (HOSPITAL_COMMUNITY): Payer: Self-pay | Admitting: Internal Medicine

## 2020-12-23 MED FILL — LISINOPRIL 2.5 MG TABLET: 2.5 | 30 days supply | Qty: 30 | Fill #0

## 2021-01-21 ENCOUNTER — Other Ambulatory Visit (HOSPITAL_COMMUNITY): Payer: Self-pay

## 2021-01-21 MED FILL — Lisinopril Tab 2.5 MG: ORAL | 90 days supply | Qty: 90 | Fill #0 | Status: AC

## 2021-02-04 ENCOUNTER — Other Ambulatory Visit (HOSPITAL_COMMUNITY): Payer: Self-pay

## 2021-02-04 MED FILL — Semaglutide Tab 7 MG: ORAL | 90 days supply | Qty: 90 | Fill #0 | Status: CN

## 2021-02-04 MED FILL — Semaglutide Tab 14 MG: ORAL | 90 days supply | Qty: 90 | Fill #0 | Status: AC

## 2021-02-05 ENCOUNTER — Other Ambulatory Visit (HOSPITAL_COMMUNITY): Payer: Self-pay

## 2021-03-17 ENCOUNTER — Other Ambulatory Visit: Payer: Self-pay | Admitting: Nurse Practitioner

## 2021-03-17 ENCOUNTER — Other Ambulatory Visit (HOSPITAL_COMMUNITY): Payer: Self-pay

## 2021-03-19 ENCOUNTER — Other Ambulatory Visit (HOSPITAL_COMMUNITY): Payer: Self-pay

## 2021-03-19 MED ORDER — CARESTART COVID-19 HOME TEST VI KIT
PACK | 0 refills | Status: DC
  Filled 2021-03-19: qty 4, 4d supply, fill #0

## 2021-03-19 MED ORDER — FARXIGA 10 MG PO TABS
10.0000 mg | ORAL_TABLET | Freq: Every day | ORAL | 1 refills | Status: DC
  Filled 2021-03-19: qty 90, 90d supply, fill #0

## 2021-03-19 MED ORDER — ICOSAPENT ETHYL 1 G PO CAPS
2.0000 g | ORAL_CAPSULE | Freq: Two times a day (BID) | ORAL | 1 refills | Status: DC
Start: 2021-03-19 — End: 2021-04-23
  Filled 2021-03-19: qty 360, 90d supply, fill #0

## 2021-03-19 MED ORDER — METFORMIN HCL 500 MG PO TABS
500.0000 mg | ORAL_TABLET | Freq: Two times a day (BID) | ORAL | 1 refills | Status: DC
  Filled 2021-03-19: qty 180, 90d supply, fill #0

## 2021-03-19 MED ORDER — ATORVASTATIN CALCIUM 40 MG PO TABS
40.0000 mg | ORAL_TABLET | Freq: Every day | ORAL | 1 refills | Status: DC
  Filled 2021-03-19: qty 30, 30d supply, fill #0
  Filled 2021-04-18: qty 30, 30d supply, fill #1

## 2021-04-06 DIAGNOSIS — E119 Type 2 diabetes mellitus without complications: Secondary | ICD-10-CM | POA: Diagnosis not present

## 2021-04-14 ENCOUNTER — Other Ambulatory Visit (HOSPITAL_COMMUNITY): Payer: Self-pay

## 2021-04-18 ENCOUNTER — Other Ambulatory Visit (HOSPITAL_COMMUNITY): Payer: Self-pay

## 2021-04-21 ENCOUNTER — Other Ambulatory Visit (HOSPITAL_COMMUNITY): Payer: Self-pay

## 2021-04-21 MED ORDER — FARXIGA 10 MG PO TABS
10.0000 mg | ORAL_TABLET | Freq: Every day | ORAL | 1 refills | Status: DC
  Filled 2021-04-21: qty 90, 90d supply, fill #0

## 2021-04-21 MED ORDER — LISINOPRIL 2.5 MG PO TABS
2.5000 mg | ORAL_TABLET | Freq: Every day | ORAL | 2 refills | Status: DC
  Filled 2021-04-21: qty 90, 90d supply, fill #0
  Filled 2021-08-10: qty 90, 90d supply, fill #1
  Filled 2021-11-13: qty 90, 90d supply, fill #2

## 2021-04-23 ENCOUNTER — Ambulatory Visit: Payer: 59 | Admitting: Nurse Practitioner

## 2021-04-23 ENCOUNTER — Encounter: Payer: Self-pay | Admitting: Nurse Practitioner

## 2021-04-23 ENCOUNTER — Other Ambulatory Visit (HOSPITAL_COMMUNITY): Payer: Self-pay

## 2021-04-23 VITALS — BP 115/83 | HR 73 | Ht 71.0 in | Wt 265.0 lb

## 2021-04-23 DIAGNOSIS — E782 Mixed hyperlipidemia: Secondary | ICD-10-CM | POA: Diagnosis not present

## 2021-04-23 DIAGNOSIS — E119 Type 2 diabetes mellitus without complications: Secondary | ICD-10-CM

## 2021-04-23 DIAGNOSIS — I10 Essential (primary) hypertension: Secondary | ICD-10-CM

## 2021-04-23 DIAGNOSIS — E559 Vitamin D deficiency, unspecified: Secondary | ICD-10-CM | POA: Diagnosis not present

## 2021-04-23 LAB — POCT GLYCOSYLATED HEMOGLOBIN (HGB A1C): Hemoglobin A1C: 6.4 % — AB (ref 4.0–5.6)

## 2021-04-23 NOTE — Addendum Note (Signed)
Addended by: Jannifer Franklin A on: 04/23/2021 10:49 AM   Modules accepted: Orders

## 2021-04-23 NOTE — Patient Instructions (Signed)
Diabetes Mellitus and Nutrition, Adult When you have diabetes, or diabetes mellitus, it is very important to have healthy eating habits because your blood sugar (glucose) levels are greatly affected by what you eat and drink. Eating healthy foods in the right amounts, at about the same times every day, can help you:  Control your blood glucose.  Lower your risk of heart disease.  Improve your blood pressure.  Reach or maintain a healthy weight. What can affect my meal plan? Every person with diabetes is different, and each person has different needs for a meal plan. Your health care provider may recommend that you work with a dietitian to make a meal plan that is best for you. Your meal plan may vary depending on factors such as:  The calories you need.  The medicines you take.  Your weight.  Your blood glucose, blood pressure, and cholesterol levels.  Your activity level.  Other health conditions you have, such as heart or kidney disease. How do carbohydrates affect me? Carbohydrates, also called carbs, affect your blood glucose level more than any other type of food. Eating carbs naturally raises the amount of glucose in your blood. Carb counting is a method for keeping track of how many carbs you eat. Counting carbs is important to keep your blood glucose at a healthy level, especially if you use insulin or take certain oral diabetes medicines. It is important to know how many carbs you can safely have in each meal. This is different for every person. Your dietitian can help you calculate how many carbs you should have at each meal and for each snack. How does alcohol affect me? Alcohol can cause a sudden decrease in blood glucose (hypoglycemia), especially if you use insulin or take certain oral diabetes medicines. Hypoglycemia can be a life-threatening condition. Symptoms of hypoglycemia, such as sleepiness, dizziness, and confusion, are similar to symptoms of having too much  alcohol.  Do not drink alcohol if: ? Your health care provider tells you not to drink. ? You are pregnant, may be pregnant, or are planning to become pregnant.  If you drink alcohol: ? Do not drink on an empty stomach. ? Limit how much you use to:  0-1 drink a day for women.  0-2 drinks a day for men. ? Be aware of how much alcohol is in your drink. In the [place] one drink equals one 12 oz bottle of beer (355 mL), one 5 oz glass of wine (148 mL), or one 1 oz glass of hard liquor (44 mL). ? Keep yourself hydrated with water, diet soda, or unsweetened iced tea.  Keep in mind that regular soda, juice, and other mixers may contain a lot of sugar and must be counted as carbs. What are tips for following this plan? Reading food labels  Start by checking the serving size on the "Nutrition Facts" label of packaged foods and drinks. The amount of calories, carbs, fats, and other nutrients listed on the label is based on one serving of the item. Many items contain more than one serving per package.  Check the total grams (g) of carbs in one serving. You can calculate the number of servings of carbs in one serving by dividing the total carbs by 15. For example, if a food has 30 g of total carbs per serving, it would be equal to 2 servings of carbs.  Check the number of grams (g) of saturated fats and trans fats in one serving. Choose foods that have   a low amount or none of these fats.  Check the number of milligrams (mg) of salt (sodium) in one serving. Most people should limit total sodium intake to less than 2,300 mg per day.  Always check the nutrition information of foods labeled as "low-fat" or "nonfat." These foods may be higher in added sugar or refined carbs and should be avoided.  Talk to your dietitian to identify your daily goals for nutrients listed on the label. Shopping  Avoid buying canned, pre-made, or processed foods. These foods tend to be high in fat, sodium, and added  sugar.  Shop around the outside edge of the grocery store. This is where you will most often find fresh fruits and vegetables, bulk grains, fresh meats, and fresh dairy. Cooking  Use low-heat cooking methods, such as baking, instead of high-heat cooking methods like deep frying.  Cook using healthy oils, such as olive, canola, or sunflower oil.  Avoid cooking with butter, cream, or high-fat meats. Meal planning  Eat meals and snacks regularly, preferably at the same times every day. Avoid going long periods of time without eating.  Eat foods that are high in fiber, such as fresh fruits, vegetables, beans, and whole grains. Talk with your dietitian about how many servings of carbs you can eat at each meal.  Eat 4-6 oz (112-168 g) of lean protein each day, such as lean meat, chicken, fish, eggs, or tofu. One ounce (oz) of lean protein is equal to: ? 1 oz (28 g) of meat, chicken, or fish. ? 1 egg. ?  cup (62 g) of tofu.  Eat some foods each day that contain healthy fats, such as avocado, nuts, seeds, and fish.   What foods should I eat? Fruits Berries. Apples. Oranges. Peaches. Apricots. Plums. Grapes. Mango. Papaya. Pomegranate. Kiwi. Cherries. Vegetables Lettuce. Spinach. Leafy greens, including kale, chard, collard greens, and mustard greens. Beets. Cauliflower. Cabbage. Broccoli. Carrots. Green beans. Tomatoes. Peppers. Onions. Cucumbers. Brussels sprouts. Grains Whole grains, such as whole-wheat or whole-grain bread, crackers, tortillas, cereal, and pasta. Unsweetened oatmeal. Quinoa. Brown or wild rice. Meats and other proteins Seafood. Poultry without skin. Lean cuts of poultry and beef. Tofu. Nuts. Seeds. Dairy Low-fat or fat-free dairy products such as milk, yogurt, and cheese. The items listed above may not be a complete list of foods and beverages you can eat. Contact a dietitian for more information. What foods should I avoid? Fruits Fruits canned with  syrup. Vegetables Canned vegetables. Frozen vegetables with butter or cream sauce. Grains Refined white flour and flour products such as bread, pasta, snack foods, and cereals. Avoid all processed foods. Meats and other proteins Fatty cuts of meat. Poultry with skin. Breaded or fried meats. Processed meat. Avoid saturated fats. Dairy Full-fat yogurt, cheese, or milk. Beverages Sweetened drinks, such as soda or iced tea. The items listed above may not be a complete list of foods and beverages you should avoid. Contact a dietitian for more information. Questions to ask a health care provider  Do I need to meet with a diabetes educator?  Do I need to meet with a dietitian?  What number can I call if I have questions?  When are the best times to check my blood glucose? Where to find more information:  American Diabetes Association: diabetes.org  Academy of Nutrition and Dietetics: www.eatright.org  National Institute of Diabetes and Digestive and Kidney Diseases: www.niddk.nih.gov  Association of Diabetes Care and Education Specialists: www.diabeteseducator.org Summary  It is important to have healthy eating   habits because your blood sugar (glucose) levels are greatly affected by what you eat and drink.  A healthy meal plan will help you control your blood glucose and maintain a healthy lifestyle.  Your health care provider may recommend that you work with a dietitian to make a meal plan that is best for you.  Keep in mind that carbohydrates (carbs) and alcohol have immediate effects on your blood glucose levels. It is important to count carbs and to use alcohol carefully. This information is not intended to replace advice given to you by your health care provider. Make sure you discuss any questions you have with your health care provider. Document Revised: 09/11/2019 Document Reviewed: 09/11/2019 Elsevier Patient Education  2021 Elsevier Inc.  

## 2021-04-23 NOTE — Progress Notes (Signed)
Endocrinology Consult Note       04/23/2021, 10:36 AM   Subjective:    Patient ID: Micheal Nelson, male    DOB: 1971-03-05.  Micheal Nelson is being seen in consultation for management of currently uncontrolled symptomatic diabetes requested by  Celene Squibb, MD.   Past Medical History:  Diagnosis Date   Diabetes ([place])    High cholesterol     Past Surgical History:  Procedure Laterality Date   HERNIA REPAIR      Social History   Socioeconomic History   Marital status: Married    Spouse name: Not on file   Number of children: 4   Years of education: Not on file   Highest education level: Not on file  Occupational History   Not on file  Tobacco Use   Smoking status: Never   Smokeless tobacco: Former    Types: Chew    Quit date: 10/18/2006  Vaping Use   Vaping Use: Never used  Substance and Sexual Activity   Alcohol use: Never   Drug use: Never   Sexual activity: Not on file  Other Topics Concern   Not on file  Social History Narrative   Not on file   Social Determinants of Health   Financial Resource Strain: Not on file  Food Insecurity: Not on file  Transportation Needs: Not on file  Physical Activity: Not on file  Stress: Not on file  Social Connections: Not on file    Family History  Problem Relation Age of Onset   Healthy Mother    Healthy Father     Outpatient Encounter Medications as of 04/23/2021  Medication Sig   atorvastatin (LIPITOR) 40 MG tablet Take 1 tablet (40 mg total) by mouth daily.   icosapent Ethyl (VASCEPA) 1 g capsule TAKE 2 CAPSULES BY MOUTH TWO TIMES DAILY FOR HIGH TRIGLYCERIDES   lisinopril (ZESTRIL) 2.5 MG tablet TAKE 1 TABLET BY MOUTH DAILY FOR RENAL PROTECTION   metFORMIN (GLUCOPHAGE) 500 MG tablet Take 1 tablet (500 mg total) by mouth 2 (two) times daily.   [DISCONTINUED] atorvastatin (LIPITOR) 40 MG tablet SMARTSIG:1 Tablet(s) By Mouth Every  Evening   [DISCONTINUED] atorvastatin (LIPITOR) 40 MG tablet TAKE 1 TABLET BY MOUTH IN THE EVENING FOR HIGH CHOLESTEROL   [DISCONTINUED] Cholecalciferol 1.25 MG (50000 UT) capsule TAKE 1 CAPSULE BY MOUTH ONCE A WEEK FOR 8 WEEKS   [DISCONTINUED] COVID-19 At Home Antigen Test (CARESTART COVID-19 HOME TEST) KIT Use as directed   [DISCONTINUED] D3-50 1.25 MG (50000 UT) capsule Take 50,000 Units by mouth once a week.   [DISCONTINUED] dapagliflozin propanediol (FARXIGA) 10 MG TABS tablet Take by mouth daily.   [DISCONTINUED] dapagliflozin propanediol (FARXIGA) 10 MG TABS tablet TAKE 1 TABLET BY MOUTH DAILY FOR DIABETES   [DISCONTINUED] dapagliflozin propanediol (FARXIGA) 10 MG TABS tablet Take 1 tablet (10 mg total) by mouth daily.   [DISCONTINUED] dapagliflozin propanediol (FARXIGA) 10 MG TABS tablet Take 1 tablet (10 mg total) by mouth daily.   [DISCONTINUED] fluticasone (FLONASE) 50 MCG/ACT nasal spray Place 1 spray into both nostrils daily for 14 days.   [DISCONTINUED] icosapent Ethyl (VASCEPA) 1 g capsule TAKE 2 CAPSULES BY MOUTH  2 TIMES A DAY FOR HIGH TRIGLYCERIDES   [DISCONTINUED] icosapent Ethyl (VASCEPA) 1 g capsule Take 2 capsules (2 g total) by mouth 2 (two) times daily.   [DISCONTINUED] levocetirizine (XYZAL) 5 MG tablet Take 5 mg by mouth every evening.   [DISCONTINUED] lisinopril (ZESTRIL) 2.5 MG tablet Take 2.5 mg by mouth daily.   [DISCONTINUED] lisinopril (ZESTRIL) 2.5 MG tablet Take 1 tablet (2.5 mg total) by mouth daily.   [DISCONTINUED] metFORMIN (GLUCOPHAGE) 500 MG tablet Take by mouth.   [DISCONTINUED] metFORMIN (GLUCOPHAGE) 500 MG tablet TAKE 1 TABLET BY MOUTH DAILY FOR 7 DAYS THEN TAKE 1 TABLET BY MOUTH TWO TIMES DAILY FOR DIABETES   [DISCONTINUED] RYBELSUS 7 MG TABS Take 1 tablet by mouth every morning.   [DISCONTINUED] Semaglutide 14 MG TABS TAKE ONE TABLET BY MOUTH DAILY AT LEAST 30 MINUTES BEFORE THE FIRST MEAL OF THE DAY   [DISCONTINUED] Semaglutide 7 MG TABS TAKE ONE TABLET  BY MOUTH DAILY AT LEAST 30 MINUTES PRIOR TO THE FIRST MEAL OF THE DAY   [DISCONTINUED] VASCEPA 1 g capsule Take 2 g by mouth 2 (two) times daily.   No facility-administered encounter medications on file as of 04/23/2021.    ALLERGIES: No Known Allergies  VACCINATION STATUS: Immunization History  Administered Date(s) Administered   Moderna Sars-Covid-2 Vaccination 01/18/2020, 02/19/2020    Diabetes He presents for his initial diabetic visit. He has type 2 diabetes mellitus. Onset time: Was diagnosed at approx age of 29. His disease course has been stable. Hypoglycemia symptoms include hunger. Associated symptoms include polyuria. There are no hypoglycemic complications. Symptoms are stable. There are no diabetic complications. Risk factors for coronary artery disease include diabetes mellitus, dyslipidemia, family history, hypertension, male sex and obesity. Current diabetic treatment includes oral agent (triple therapy). He is compliant with treatment most of the time. His weight is stable. He is following a generally healthy diet. When asked about meal planning, he reported none. He has not had a previous visit with a dietitian. He participates in exercise every other day. (He presents today for his consultation with no meter or logs to review.  His POCT A1c today is 6.4%, improving from last visit with his PCP of 7.2% (highest A1c at time of diagnosis was 13.1%).  He requested this referral as he wants to see if he really needs all of the medications he is on.  He does not monitor his glucose routinely.  He admits to drinking flavored water, diet soda, and sugar-free tea along with his water intake through the day.  He does typically eat 3 meals per day and eats heavily in the evening time to avoid low glucose.  He engages in routine physical activity at least 4 times per week going to the gym.  He does report symptoms of low glucose, usually in the evenings, but did not check his glucose reading at  that time.) An ACE inhibitor/angiotensin II receptor blocker is being taken. He does not see a podiatrist.Eye exam is current.  Hyperlipidemia This is a chronic problem. The current episode started more than 1 year ago. The problem is uncontrolled. Recent lipid tests were reviewed and are variable. Exacerbating diseases include diabetes and obesity. Factors aggravating his hyperlipidemia include fatty foods. Current antihyperlipidemic treatment includes statins and herbal therapy. The current treatment provides moderate improvement of lipids. Compliance problems include adherence to diet and adherence to exercise.  Risk factors for coronary artery disease include diabetes mellitus, dyslipidemia, family history, hypertension, male sex and obesity.  Hypertension This is a chronic problem. The current episode started more than 1 year ago. The problem has been resolved since onset. The problem is controlled. There are no associated agents to hypertension. Risk factors for coronary artery disease include diabetes mellitus, dyslipidemia, family history, male gender and obesity. Past treatments include ACE inhibitors. The current treatment provides significant improvement. There are no compliance problems.     Review of systems  Constitutional: + Minimally fluctuating body weight, current Body mass index is 36.96 kg/m., no fatigue, no subjective hyperthermia, no subjective hypothermia Eyes: no blurry vision, no xerophthalmia ENT: no sore throat, no nodules palpated in throat, no dysphagia/odynophagia, no hoarseness Cardiovascular: no chest pain, no shortness of breath, no palpitations, no leg swelling Respiratory: no cough, no shortness of breath Gastrointestinal: +nausea after taking Rybelsus in the mornings, + alternating constipation and diarrhea Genitourinary: + polyuria Musculoskeletal: no muscle/joint aches, does report intermittent joint stiffness Skin: no rashes, no hyperemia Neurological: no  tremors, no numbness, no tingling, no dizziness Psychiatric: no depression, no anxiety  Objective:     BP 115/83   Pulse 73   Ht _0  (1.803 m)   Wt 265 lb (120.2 kg)   BMI 36.96 kg/m   Wt Readings from Last 3 Encounters:  04/23/21 265 lb (120.2 kg)  08/05/20 265 lb (120.2 kg)  06/17/20 265 lb (120.2 kg)     BP Readings from Last 3 Encounters:  04/23/21 115/83  08/05/20 113/76  06/17/20 111/74     Physical Exam- Limited  Constitutional:  Body mass index is 36.96 kg/m. , not in acute distress, normal state of mind Eyes:  EOMI, no exophthalmos Neck: Supple Cardiovascular: RRR, no murmurs, rubs, or gallops, no edema Respiratory: Adequate breathing efforts, no crackles, rales, rhonchi, or wheezing Musculoskeletal: no gross deformities, strength intact in all four extremities, no gross restriction of joint movements Skin:  no rashes, no hyperemia Neurological: no tremor with outstretched hands    CMP ( most recent) CMP     Component Value Date/Time   NA 136 09/11/2007 1032   K 3.9 09/11/2007 1032   CL 100 09/11/2007 1032   CO2 28 09/11/2007 1032   GLUCOSE 103 (H) 09/11/2007 1032   BUN 18 10/23/2020 0000   CREATININE 0.8 10/23/2020 0000   CREATININE 0.79 09/11/2007 1032   CALCIUM 10.1 09/11/2007 1032   GFRNONAA 109 10/23/2020 0000   GFRAA  09/11/2007 1032    >60        The eGFR has been calculated using the MDRD equation. This calculation has not been validated in all clinical     Diabetic Labs (most recent): Lab Results  Component Value Date   HGBA1C 7.2 10/23/2020     Lipid Panel ( most recent) Lipid Panel     Component Value Date/Time   TRIG 185 (A) 10/23/2020 0000   LDLCALC 74 10/23/2020 0000      No results found for: TSH, FREET4         Assessment & Plan:   1) Type 2 diabetes mellitus without complication, without long-term current use of insulin ([place])  He presents today for his consultation with no meter or logs to review.  His  POCT A1c today is 6.4%, improving from last visit with his PCP of 7.2% (highest A1c at time of diagnosis was 13.1%).  He requested this referral as he wants to see if he really needs all of the medications he is on.  He does not monitor his glucose routinely.  He admits to drinking flavored water, diet soda, and sugar-free tea along with his water intake through the day.  He does typically eat 3 meals per day and eats heavily in the evening time to avoid low glucose.  He engages in routine physical activity at least 4 times per week going to the gym.  He does report symptoms of low glucose, usually in the evenings, but did not check his glucose reading at that time.  - Micheal Nelson has currently uncontrolled symptomatic type 2 DM since 50 years of age, with most recent A1c of 6.4 %.   -Recent labs reviewed.  - I had a long discussion with him about the progressive nature of diabetes and the pathology behind its complications. -his diabetes is not currently complicated but he remains at a high risk for more acute and chronic complications which include CAD, CVA, CKD, retinopathy, and neuropathy. These are all discussed in detail with him.  - I have counseled him on diet and weight management by adopting a carbohydrate restricted/protein rich diet. Patient is encouraged to switch to unprocessed or minimally processed complex starch and increased protein intake (animal or plant source), fruits, and vegetables. -  he is advised to stick to a routine mealtimes to eat 3 meals a day and avoid unnecessary snacks (to snack only to correct hypoglycemia).   - he acknowledges that there is a room for improvement in his food and drink choices. - Suggestion is made for him to avoid simple carbohydrates from his diet including Cakes, Sweet Desserts, Ice Cream, Soda (diet and regular), Sweet Tea, Candies, Chips, Cookies, Store Bought Juices, Alcohol in Excess of 1-2 drinks a day, Artificial Sweeteners, Coffee  Creamer, and "Sugar-free" Products. This will help patient to have more stable blood glucose profile and potentially avoid unintended weight gain.  - I have approached him with the following individualized plan to manage his diabetes and patient agrees:   -he is encouraged to start monitoring glucose 4 times daily, before meals and before bed, to log their readings on the clinic sheets provided, and bring them to review at follow up appointment in 2 weeks.  - Adjustment parameters are given to him for hypo and hyperglycemia in writing. - he is encouraged to call clinic for blood glucose levels less than 70 or above 300 mg /dl. - he is advised to continue Metformin 500 mg po twice daily with meals, therapeutically suitable for patient . - his Wilder Glade will be discontinued, risks outweigh benefits in his case plus it was duplicate therapy with its use with Rybelsus.  - Given his nausea with high dose of Rybelsus, I recommended he temporarily stop this medication.  We may restart at his follow up appointment in 2 weeks at lower dose.  - Specific targets for  A1c; LDL, HDL, and Triglycerides were discussed with the patient.  2) Blood Pressure /Hypertension:  his blood pressure is controlled to target.   he is advised to continue his current medications including Lisinopril 2.5 mg p.o. daily with breakfast.  3) Lipids/Hyperlipidemia:    Review of his recent lipid panel from 10/23/20 showed controlled LDL at 74 and elevated triglycerides of 185 .  he is advised to continue Atorvastatin 40 mg daily at bedtime.  Side effects and precautions discussed with him.  4)  Weight/Diet:  his Body mass index is 36.96 kg/m.  -  clearly complicating his diabetes care.   he is a candidate for weight loss. I discussed with him  the fact that loss of 5 - 10% of his  current body weight will have the most impact on his diabetes management.  Exercise, and detailed carbohydrates information provided  -  detailed on  discharge instructions.  5) Vitamin D Deficiency: His most recent vitamin D level was 24.2 on 10/23/20.  He is not currently on any supplementation.  Will recheck Vitamin D level on subsequent visits.  6) Chronic Care/Health Maintenance: -he is on ACEI/ARB and Statin medications and is encouraged to initiate and continue to follow up with Ophthalmology, Dentist, Podiatrist at least yearly or according to recommendations, and advised to stay away from smoking. I have recommended yearly flu vaccine and pneumonia vaccine at least every 5 years; moderate intensity exercise for up to 150 minutes weekly; and sleep for at least 7 hours a day.  - he is advised to maintain close follow up with Celene Squibb, MD for primary care needs, as well as his other providers for optimal and coordinated care.   - Time spent in this patient care: 60 min, of which > 50% was spent in counseling him about his diabetes and the rest reviewing his blood glucose logs, discussing his hypoglycemia and hyperglycemia episodes, reviewing his current and previous labs/studies (including abstraction from other facilities) and medications doses and developing a long term treatment plan based on the latest standards of care/guidelines; and documenting his care.    Please refer to Patient Instructions for Blood Glucose Monitoring and Insulin/Medications Dosing Guide" in media tab for additional information. Please also refer to "Patient Self Inventory" in the Media tab for reviewed elements of pertinent patient history.  Micheal Nelson participated in the discussions, expressed understanding, and voiced agreement with the above plans.  All questions were answered to his satisfaction. he is encouraged to contact clinic should he have any questions or concerns prior to his return visit.     Follow up plan: - Return in about 2 weeks (around 05/07/2021) for Diabetes F/U, Bring meter and logs, ABI next visit.    Rayetta Pigg,  [facility] [address] [place], [place] [address] Phone: [phone] Fax: [phone]  04/23/2021, 10:36 AM

## 2021-05-01 ENCOUNTER — Encounter: Payer: Self-pay | Admitting: Nurse Practitioner

## 2021-05-04 ENCOUNTER — Other Ambulatory Visit (HOSPITAL_COMMUNITY): Payer: Self-pay

## 2021-05-04 ENCOUNTER — Other Ambulatory Visit: Payer: Self-pay

## 2021-05-04 ENCOUNTER — Other Ambulatory Visit: Payer: Self-pay | Admitting: Nurse Practitioner

## 2021-05-04 DIAGNOSIS — E119 Type 2 diabetes mellitus without complications: Secondary | ICD-10-CM

## 2021-05-04 MED ORDER — FREESTYLE LITE W/DEVICE KIT: 1.0000 | PACK | Freq: Once | 0 refills | Status: DC

## 2021-05-04 MED ORDER — FREESTYLE LITE TEST VI STRP: ORAL_STRIP | 12 refills | Status: DC

## 2021-05-04 MED ORDER — GLUCOSE BLOOD VI STRP
1.0000 | ORAL_STRIP | Freq: Four times a day (QID) | 2 refills | Status: DC
  Filled 2021-05-04: qty 350, 88d supply, fill #0

## 2021-05-04 MED ORDER — FREESTYLE LANCETS MISC: 12 refills | Status: AC

## 2021-05-04 NOTE — Progress Notes (Signed)
fre

## 2021-05-07 NOTE — Patient Instructions (Signed)

## 2021-05-08 ENCOUNTER — Ambulatory Visit (INDEPENDENT_AMBULATORY_CARE_PROVIDER_SITE_OTHER): Payer: 59 | Admitting: Nurse Practitioner

## 2021-05-08 ENCOUNTER — Other Ambulatory Visit: Payer: Self-pay

## 2021-05-08 ENCOUNTER — Other Ambulatory Visit (HOSPITAL_COMMUNITY): Payer: Self-pay

## 2021-05-08 ENCOUNTER — Encounter: Payer: Self-pay | Admitting: Nurse Practitioner

## 2021-05-08 VITALS — BP 111/74 | HR 75 | Ht 71.0 in | Wt 267.6 lb

## 2021-05-08 DIAGNOSIS — E119 Type 2 diabetes mellitus without complications: Secondary | ICD-10-CM | POA: Diagnosis not present

## 2021-05-08 DIAGNOSIS — E782 Mixed hyperlipidemia: Secondary | ICD-10-CM

## 2021-05-08 DIAGNOSIS — E559 Vitamin D deficiency, unspecified: Secondary | ICD-10-CM | POA: Diagnosis not present

## 2021-05-08 DIAGNOSIS — I10 Essential (primary) hypertension: Secondary | ICD-10-CM | POA: Diagnosis not present

## 2021-05-08 MED ORDER — METFORMIN HCL 1000 MG PO TABS
1000.0000 mg | ORAL_TABLET | Freq: Two times a day (BID) | ORAL | 3 refills | Status: DC
  Filled 2021-05-08: qty 180, 90d supply, fill #0
  Filled 2021-08-24: qty 180, 90d supply, fill #1
  Filled 2021-11-13: qty 180, 90d supply, fill #2
  Filled 2022-02-11: qty 180, 90d supply, fill #3

## 2021-05-08 NOTE — Progress Notes (Signed)
Endocrinology Follow Up Note       05/08/2021, 10:52 AM   Subjective:    Patient ID: Micheal Nelson, male    DOB: 21-Sep-1971.  Micheal Nelson is being seen in follow up after being seen in consultation for management of currently uncontrolled symptomatic diabetes requested by  Celene Squibb, MD.   Past Medical History:  Diagnosis Date   Diabetes ([place])    High cholesterol     Past Surgical History:  Procedure Laterality Date   HERNIA REPAIR      Social History   Socioeconomic History   Marital status: Married    Spouse name: Not on file   Number of children: 4   Years of education: Not on file   Highest education level: Not on file  Occupational History   Not on file  Tobacco Use   Smoking status: Never   Smokeless tobacco: Former    Types: Chew    Quit date: 10/18/2006  Vaping Use   Vaping Use: Never used  Substance and Sexual Activity   Alcohol use: Never   Drug use: Never   Sexual activity: Not on file  Other Topics Concern   Not on file  Social History Narrative   Not on file   Social Determinants of Health   Financial Resource Strain: Not on file  Food Insecurity: Not on file  Transportation Needs: Not on file  Physical Activity: Not on file  Stress: Not on file  Social Connections: Not on file    Family History  Problem Relation Age of Onset   Healthy Mother    Healthy Father     Outpatient Encounter Medications as of 05/08/2021  Medication Sig   atorvastatin (LIPITOR) 40 MG tablet Take 1 tablet (40 mg total) by mouth daily.   Blood Glucose Monitoring Suppl (FREESTYLE LITE) w/Device KIT 1 kit by Does not apply route once for 1 dose.   glucose blood (FREESTYLE LITE) test strip Use as instructed to monitor glucose once daily.   icosapent Ethyl (VASCEPA) 1 g capsule TAKE 2 CAPSULES BY MOUTH TWO TIMES DAILY FOR HIGH TRIGLYCERIDES   Lancets (FREESTYLE) lancets Use as  instructed to monitor glucose once daily   lisinopril (ZESTRIL) 2.5 MG tablet TAKE 1 TABLET BY MOUTH DAILY FOR RENAL PROTECTION   metFORMIN (GLUCOPHAGE) 1000 MG tablet Take 1 tablet (1,000 mg total) by mouth 2 (two) times daily with a meal.   [DISCONTINUED] metFORMIN (GLUCOPHAGE) 500 MG tablet Take 1 tablet (500 mg total) by mouth 2 (two) times daily.   No facility-administered encounter medications on file as of 05/08/2021.    ALLERGIES: No Known Allergies  VACCINATION STATUS: Immunization History  Administered Date(s) Administered   Moderna Sars-Covid-2 Vaccination 01/18/2020, 02/19/2020    Diabetes He presents for his follow-up diabetic visit. He has type 2 diabetes mellitus. Onset time: Was diagnosed at approx age of 11. His disease course has been stable. There are no hypoglycemic associated symptoms. Pertinent negatives for diabetes include no polyuria. There are no hypoglycemic complications. Symptoms are stable. There are no diabetic complications. Risk factors for coronary artery disease include diabetes mellitus, dyslipidemia, family history, hypertension, male sex and obesity.  Current diabetic treatment includes oral agent (monotherapy). He is compliant with treatment most of the time. His weight is fluctuating minimally. He is following a generally healthy diet. When asked about meal planning, he reported none. He has not had a previous visit with a dietitian. He participates in exercise every other day. His home blood glucose trend is fluctuating minimally. His breakfast blood glucose range is generally 140-180 mg/dl. His lunch blood glucose range is generally 130-140 mg/dl. His dinner blood glucose range is generally 110-130 mg/dl. His bedtime blood glucose range is generally 130-140 mg/dl. (He presents today with his meter and logs showing slightly above target fasting and at goal postprandial glycemic profile.  He reports he did go on vacation since initial consult and he did eat  things he shouldn't but he is getting back on track now.  He reports his nausea and urinary frequency is better now that some of his other medications have been discontinued. Analysis of his meter shows 7-day average of 151, 14-day average of 140, 30-day average of 139.) An ACE inhibitor/angiotensin II receptor blocker is being taken. He does not see a podiatrist.Eye exam is current.  Hyperlipidemia This is a chronic problem. The current episode started more than 1 year ago. The problem is uncontrolled. Recent lipid tests were reviewed and are variable. Exacerbating diseases include diabetes and obesity. Factors aggravating his hyperlipidemia include fatty foods. Current antihyperlipidemic treatment includes statins and herbal therapy. The current treatment provides moderate improvement of lipids. Compliance problems include adherence to diet and adherence to exercise.  Risk factors for coronary artery disease include diabetes mellitus, dyslipidemia, family history, hypertension, male sex and obesity.  Hypertension This is a chronic problem. The current episode started more than 1 year ago. The problem has been resolved since onset. The problem is controlled. There are no associated agents to hypertension. Risk factors for coronary artery disease include diabetes mellitus, dyslipidemia, family history, male gender and obesity. Past treatments include ACE inhibitors. The current treatment provides significant improvement. There are no compliance problems.     Review of systems  Constitutional: + Minimally fluctuating body weight,  current Body mass index is 37.32 kg/m. , no fatigue, no subjective hyperthermia, no subjective hypothermia Eyes: no blurry vision, no xerophthalmia ENT: no sore throat, no nodules palpated in throat, no dysphagia/odynophagia, no hoarseness Cardiovascular: no chest pain, no shortness of breath, no palpitations, no leg swelling Respiratory: no cough, no shortness of  breath Gastrointestinal: no nausea/vomiting/diarrhea Musculoskeletal: no muscle/joint aches Skin: no rashes, no hyperemia Neurological: no tremors, no numbness, no tingling, no dizziness Psychiatric: no depression, no anxiety  Objective:     BP 111/74   Pulse 75   Ht '5\' 11"'  (1.803 m)   Wt 267 lb 9.6 oz (121.4 kg)   BMI 37.32 kg/m   Wt Readings from Last 3 Encounters:  05/08/21 267 lb 9.6 oz (121.4 kg)  04/23/21 265 lb (120.2 kg)  08/05/20 265 lb (120.2 kg)     BP Readings from Last 3 Encounters:  05/08/21 111/74  04/23/21 115/83  08/05/20 113/76     Physical Exam- Limited  Constitutional:  Body mass index is 37.32 kg/m. , not in acute distress, normal state of mind Eyes:  EOMI, no exophthalmos Neck: Supple Cardiovascular: RRR, no murmurs, rubs, or gallops, no edema Respiratory: Adequate breathing efforts, no crackles, rales, rhonchi, or wheezing Musculoskeletal: no gross deformities, strength intact in all four extremities, no gross restriction of joint movements Skin:  no rashes, no hyperemia Neurological:  no tremor with outstretched hands    CMP ( most recent) CMP     Component Value Date/Time   NA 136 09/11/2007 1032   K 3.9 09/11/2007 1032   CL 100 09/11/2007 1032   CO2 28 09/11/2007 1032   GLUCOSE 103 (H) 09/11/2007 1032   BUN 18 10/23/2020 0000   CREATININE 0.8 10/23/2020 0000   CREATININE 0.79 09/11/2007 1032   CALCIUM 10.1 09/11/2007 1032   GFRNONAA 109 10/23/2020 0000   GFRAA  09/11/2007 1032    >60        The eGFR has been calculated using the MDRD equation. This calculation has not been validated in all clinical     Diabetic Labs (most recent): Lab Results  Component Value Date   HGBA1C 6.4 (A) 04/23/2021   HGBA1C 7.2 10/23/2020     Lipid Panel ( most recent) Lipid Panel     Component Value Date/Time   TRIG 185 (A) 10/23/2020 0000   LDLCALC 74 10/23/2020 0000      No results found for: TSH, FREET4         Assessment &  Plan:   1) Type 2 diabetes mellitus without complication, without long-term current use of insulin ([place])  He presents today with his meter and logs showing slightly above target fasting and at goal postprandial glycemic profile.  He reports he did go on vacation since initial consult and he did eat things he shouldn't but he is getting back on track now.  He reports his nausea and urinary frequency is better now that some of his other medications have been discontinued. Analysis of his meter shows 7-day average of 151, 14-day average of 140, 30-day average of 139.  - TRAJON ROSETE has currently uncontrolled symptomatic type 2 DM since 50 years of age, with most recent A1c of 6.4 %.   -Recent labs reviewed.  - I had a long discussion with him about the progressive nature of diabetes and the pathology behind its complications. -his diabetes is not currently complicated but he remains at a high risk for more acute and chronic complications which include CAD, CVA, CKD, retinopathy, and neuropathy. These are all discussed in detail with him.  - Nutritional counseling repeated at each appointment due to patients tendency to fall back in to old habits.  - The patient admits there is a room for improvement in their diet and drink choices. -  Suggestion is made for the patient to avoid simple carbohydrates from their diet including Cakes, Sweet Desserts / Pastries, Ice Cream, Soda (diet and regular), Sweet Tea, Candies, Chips, Cookies, Sweet Pastries, Store Bought Juices, Alcohol in Excess of 1-2 drinks a day, Artificial Sweeteners, Coffee Creamer, and "Sugar-free" Products. This will help patient to have stable blood glucose profile and potentially avoid unintended weight gain.   - I encouraged the patient to switch to unprocessed or minimally processed complex starch and increased protein intake (animal or plant source), fruits, and vegetables.   - Patient is advised to stick to a routine mealtimes  to eat 3 meals a day and avoid unnecessary snacks (to snack only to correct hypoglycemia).  - I have approached him with the following individualized plan to manage his diabetes and patient agrees:   -Based on his stable glycemic profile but above target fasting glycemic profile, he is advised to increase his Metformin to 1000 mg po twice daily with meals.  He is advised to continue monitoring blood glucose at least once daily, before  breakfast and to call the clinic if he has readings less than 70 or greater than 200 for 3 tests in a row.   - Given his nausea with high dose of Rybelsus, I recommend holding off on this for now.  - Specific targets for  A1c; LDL, HDL, and Triglycerides were discussed with the patient.  2) Blood Pressure /Hypertension:  his blood pressure is controlled to target.   he is advised to continue his current medications including Lisinopril 2.5 mg p.o. daily with breakfast.  3) Lipids/Hyperlipidemia:    Review of his recent lipid panel from 10/23/20 showed controlled LDL at 74 and elevated triglycerides of 185 .  he is advised to continue Atorvastatin 40 mg daily at bedtime.  Side effects and precautions discussed with him.  4)  Weight/Diet:  his Body mass index is 37.32 kg/m.  -  clearly complicating his diabetes care.   he is a candidate for weight loss. I discussed with him the fact that loss of 5 - 10% of his  current body weight will have the most impact on his diabetes management.  Exercise, and detailed carbohydrates information provided  -  detailed on discharge instructions.  5) Vitamin D Deficiency: His most recent vitamin D level was 24.2 on 10/23/20.  He is not currently on any supplementation.  Will recheck Vitamin D level on subsequent visits.  6) Chronic Care/Health Maintenance: -he is on ACEI/ARB and Statin medications and is encouraged to initiate and continue to follow up with Ophthalmology, Dentist, Podiatrist at least yearly or according to  recommendations, and advised to stay away from smoking. I have recommended yearly flu vaccine and pneumonia vaccine at least every 5 years; moderate intensity exercise for up to 150 minutes weekly; and sleep for at least 7 hours a day.  - he is advised to maintain close follow up with Celene Squibb, MD for primary care needs, as well as his other providers for optimal and coordinated care.     I spent 35 minutes in the care of the patient today including review of labs from [facility], Lipids, Thyroid Function, Hematology (current and previous including abstractions from other facilities); face-to-face time discussing  his blood glucose readings/logs, discussing hypoglycemia and hyperglycemia episodes and symptoms, medications doses, his options of short and long term treatment based on the latest standards of care / guidelines;  discussion about incorporating lifestyle medicine;  and documenting the encounter.    Please refer to Patient Instructions for Blood Glucose Monitoring and Insulin/Medications Dosing Guide"  in media tab for additional information. Please  also refer to " Patient Self Inventory" in the Media  tab for reviewed elements of pertinent patient history.  Bonne Dolores participated in the discussions, expressed understanding, and voiced agreement with the above plans.  All questions were answered to his satisfaction. he is encouraged to contact clinic should he have any questions or concerns prior to his return visit.     Follow up plan: - Return in about 3 months (around 08/08/2021) for Diabetes F/U with A1c in office, Previsit labs, Bring meter and logs.    Rayetta Pigg, [facility] [address] [place], [place] [address] Phone: [phone] Fax: [phone]  05/08/2021, 10:52 AM

## 2021-05-15 ENCOUNTER — Other Ambulatory Visit (HOSPITAL_COMMUNITY): Payer: Self-pay

## 2021-05-20 ENCOUNTER — Telehealth: Payer: Self-pay

## 2021-05-20 ENCOUNTER — Other Ambulatory Visit (HOSPITAL_COMMUNITY): Payer: Self-pay

## 2021-05-20 MED ORDER — ATORVASTATIN CALCIUM 40 MG PO TABS
40.0000 mg | ORAL_TABLET | Freq: Every day | ORAL | 3 refills | Status: DC
  Filled 2021-05-20: qty 90, 90d supply, fill #0
  Filled 2021-08-19: qty 90, 90d supply, fill #1
  Filled 2021-11-13: qty 90, 90d supply, fill #2
  Filled 2022-02-11: qty 90, 90d supply, fill #3

## 2021-05-20 NOTE — Telephone Encounter (Signed)
Patient states he is down to 2 pills for his Lipitor 40 mg. He uses [facility]. Thanks

## 2021-07-21 ENCOUNTER — Encounter: Payer: Self-pay | Admitting: Nurse Practitioner

## 2021-07-21 DIAGNOSIS — E119 Type 2 diabetes mellitus without complications: Secondary | ICD-10-CM | POA: Diagnosis not present

## 2021-07-21 DIAGNOSIS — Z7984 Long term (current) use of oral hypoglycemic drugs: Secondary | ICD-10-CM | POA: Diagnosis not present

## 2021-07-21 DIAGNOSIS — H524 Presbyopia: Secondary | ICD-10-CM | POA: Diagnosis not present

## 2021-07-21 LAB — HM DIABETES EYE EXAM

## 2021-08-10 ENCOUNTER — Other Ambulatory Visit (HOSPITAL_COMMUNITY): Payer: Self-pay

## 2021-08-10 DIAGNOSIS — E119 Type 2 diabetes mellitus without complications: Secondary | ICD-10-CM | POA: Diagnosis not present

## 2021-08-11 LAB — COMPREHENSIVE METABOLIC PANEL
ALT: 58 IU/L — ABNORMAL HIGH (ref 0–44)
AST: 29 IU/L (ref 0–40)
Albumin/Globulin Ratio: 1.9 (ref 1.2–2.2)
Albumin: 4.8 g/dL (ref 4.0–5.0)
Alkaline Phosphatase: 100 IU/L (ref 44–121)
BUN/Creatinine Ratio: 17 (ref 9–20)
BUN: 12 mg/dL (ref 6–24)
Bilirubin Total: 0.4 mg/dL (ref 0.0–1.2)
CO2: 22 mmol/L (ref 20–29)
Calcium: 9.5 mg/dL (ref 8.7–10.2)
Chloride: 100 mmol/L (ref 96–106)
Creatinine, Ser: 0.72 mg/dL — ABNORMAL LOW (ref 0.76–1.27)
Globulin, Total: 2.5 g/dL (ref 1.5–4.5)
Glucose: 213 mg/dL — ABNORMAL HIGH (ref 70–99)
Potassium: 4.5 mmol/L (ref 3.5–5.2)
Sodium: 137 mmol/L (ref 134–144)
Total Protein: 7.3 g/dL (ref 6.0–8.5)
eGFR: 113 mL/min/{1.73_m2} (ref >59)

## 2021-08-13 ENCOUNTER — Other Ambulatory Visit: Payer: Self-pay

## 2021-08-13 ENCOUNTER — Ambulatory Visit: Payer: 59 | Admitting: Nurse Practitioner

## 2021-08-13 ENCOUNTER — Other Ambulatory Visit (HOSPITAL_COMMUNITY): Payer: Self-pay

## 2021-08-13 ENCOUNTER — Encounter: Payer: Self-pay | Admitting: Nurse Practitioner

## 2021-08-13 VITALS — BP 149/85 | HR 78 | Ht 71.0 in | Wt 271.0 lb

## 2021-08-13 DIAGNOSIS — E559 Vitamin D deficiency, unspecified: Secondary | ICD-10-CM | POA: Diagnosis not present

## 2021-08-13 DIAGNOSIS — I10 Essential (primary) hypertension: Secondary | ICD-10-CM | POA: Diagnosis not present

## 2021-08-13 DIAGNOSIS — E119 Type 2 diabetes mellitus without complications: Secondary | ICD-10-CM

## 2021-08-13 DIAGNOSIS — E782 Mixed hyperlipidemia: Secondary | ICD-10-CM | POA: Diagnosis not present

## 2021-08-13 LAB — POCT GLYCOSYLATED HEMOGLOBIN (HGB A1C): HbA1c, POC (controlled diabetic range): 8 % — AB (ref 0.0–7.0)

## 2021-08-13 MED ORDER — RYBELSUS 7 MG PO TABS
7.0000 mg | ORAL_TABLET | Freq: Every day | ORAL | 3 refills | Status: DC
  Filled 2021-08-13: qty 90, 90d supply, fill #0

## 2021-08-13 NOTE — Addendum Note (Signed)
Addended by: Ronita Hipps on: 08/13/2021 09:11 AM   Modules accepted: Orders

## 2021-08-13 NOTE — Patient Instructions (Signed)

## 2021-08-13 NOTE — Progress Notes (Addendum)
Endocrinology Follow Up Note       08/13/2021, 9:01 AM   Subjective:    Patient ID: Micheal Nelson, male    DOB: 04/07/1971.  Micheal Nelson is being seen in follow up after being seen in consultation for management of currently uncontrolled symptomatic diabetes requested by  Celene Squibb, MD.   Past Medical History:  Diagnosis Date   Diabetes ([place])    High cholesterol     Past Surgical History:  Procedure Laterality Date   HERNIA REPAIR      Social History   Socioeconomic History   Marital status: Married    Spouse name: Not on file   Number of children: 4   Years of education: Not on file   Highest education level: Not on file  Occupational History   Not on file  Tobacco Use   Smoking status: Never   Smokeless tobacco: Former    Types: Chew    Quit date: 10/18/2006  Vaping Use   Vaping Use: Never used  Substance and Sexual Activity   Alcohol use: Never   Drug use: Never   Sexual activity: Not on file  Other Topics Concern   Not on file  Social History Narrative   Not on file   Social Determinants of Health   Financial Resource Strain: Not on file  Food Insecurity: Not on file  Transportation Needs: Not on file  Physical Activity: Not on file  Stress: Not on file  Social Connections: Not on file    Family History  Problem Relation Age of Onset   Healthy Mother    Healthy Father     Outpatient Encounter Medications as of 08/13/2021  Medication Sig   atorvastatin (LIPITOR) 40 MG tablet Take 1 tablet (40 mg total) by mouth daily.   glucose blood (FREESTYLE LITE) test strip Use as instructed to monitor glucose once daily.   icosapent Ethyl (VASCEPA) 1 g capsule TAKE 2 CAPSULES BY MOUTH TWO TIMES DAILY FOR HIGH TRIGLYCERIDES   Lancets (FREESTYLE) lancets Use as instructed to monitor glucose once daily   levocetirizine (XYZAL) 5 MG tablet Take 5 mg by mouth every evening.    lisinopril (ZESTRIL) 2.5 MG tablet Take 1 tablet (2.5 mg total) by mouth daily.   metFORMIN (GLUCOPHAGE) 1000 MG tablet Take 1 tablet (1,000 mg total) by mouth 2 (two) times daily with a meal.   Semaglutide (RYBELSUS) 7 MG TABS Take 7 mg by mouth daily.   Blood Glucose Monitoring Suppl (FREESTYLE LITE) w/Device KIT 1 kit by Does not apply route once for 1 dose.   [DISCONTINUED] lisinopril (ZESTRIL) 2.5 MG tablet TAKE 1 TABLET BY MOUTH DAILY FOR RENAL PROTECTION (Patient not taking: Reported on 08/13/2021)   No facility-administered encounter medications on file as of 08/13/2021.    ALLERGIES: No Known Allergies  VACCINATION STATUS: Immunization History  Administered Date(s) Administered   Moderna Sars-Covid-2 Vaccination 01/18/2020, 02/19/2020    Diabetes He presents for his follow-up diabetic visit. He has type 2 diabetes mellitus. Onset time: Was diagnosed at approx age of 32. His disease course has been worsening. There are no hypoglycemic associated symptoms. Associated symptoms include fatigue. Pertinent negatives for  diabetes include no polyuria and no weight loss. There are no hypoglycemic complications. Symptoms are stable. There are no diabetic complications. Risk factors for coronary artery disease include diabetes mellitus, dyslipidemia, family history, hypertension, male sex and obesity. Current diabetic treatment includes oral agent (monotherapy). He is compliant with treatment most of the time. His weight is fluctuating minimally. He is following a generally healthy diet. When asked about meal planning, he reported none. He has not had a previous visit with a dietitian. He participates in exercise every other day. His home blood glucose trend is increasing steadily. (He presents today with no meter or logs to review (was not asked to routinely monitor due to monotherapy with Metformin alone).  He reports morning glucose readings in the 190s recently.  Had a brief period after he had  his flu shot that his glucose shot up in the mid 200s.  He also admits he has been eating poorly lately due to his busy schedule.  He denies any hypoglycemia.  He does report he took some of his left over Rybelsus which helped and did not cause him significant nausea.) An ACE inhibitor/angiotensin II receptor blocker is being taken. He does not see a podiatrist.Eye exam is current.  Hyperlipidemia This is a chronic problem. The current episode started more than 1 year ago. The problem is uncontrolled. Recent lipid tests were reviewed and are variable. Exacerbating diseases include diabetes and obesity. Factors aggravating his hyperlipidemia include fatty foods. Current antihyperlipidemic treatment includes statins and herbal therapy. The current treatment provides moderate improvement of lipids. Compliance problems include adherence to diet and adherence to exercise.  Risk factors for coronary artery disease include diabetes mellitus, dyslipidemia, family history, hypertension, male sex and obesity.  Hypertension This is a chronic problem. The current episode started more than 1 year ago. The problem has been resolved since onset. The problem is controlled. There are no associated agents to hypertension. Risk factors for coronary artery disease include diabetes mellitus, dyslipidemia, family history, male gender and obesity. Past treatments include ACE inhibitors. The current treatment provides significant improvement. There are no compliance problems.     Review of systems  Constitutional: + Minimally fluctuating body weight,  current Body mass index is 37.8 kg/m. , + fatigue, no subjective hyperthermia, no subjective hypothermia Eyes: no blurry vision, no xerophthalmia ENT: no sore throat, no nodules palpated in throat, no dysphagia/odynophagia, no hoarseness Cardiovascular: no chest pain, no shortness of breath, no palpitations, no leg swelling Respiratory: no cough, no shortness of  breath Gastrointestinal: no nausea/vomiting/diarrhea Musculoskeletal: right knee pain- after sitting for extended period of time Skin: no rashes, no hyperemia Neurological: no tremors, no numbness, no tingling, no dizziness Psychiatric: no depression, no anxiety  Objective:     BP (!) 149/85   Pulse 78   Ht '5\' 11"'  (1.803 m)   Wt 271 lb (122.9 kg)   BMI 37.80 kg/m   Wt Readings from Last 3 Encounters:  08/13/21 271 lb (122.9 kg)  05/08/21 267 lb 9.6 oz (121.4 kg)  04/23/21 265 lb (120.2 kg)     BP Readings from Last 3 Encounters:  08/13/21 (!) 149/85  05/08/21 111/74  04/23/21 115/83       Physical Exam- Limited  Constitutional:  Body mass index is 37.8 kg/m. , not in acute distress, normal state of mind Eyes:  EOMI, no exophthalmos Neck: Supple Cardiovascular: RRR, no murmurs, rubs, or gallops, no edema Respiratory: Adequate breathing efforts, no crackles, rales, rhonchi, or wheezing Musculoskeletal:  no gross deformities, strength intact in all four extremities, no gross restriction of joint movements Skin:  no rashes, no hyperemia Neurological: no tremor with outstretched hands   POCT ABI Results 08/13/21   Right ABI:  1.12      Left ABI:  1.15  Right leg systolic / diastolic: 035/59 mmHg Left leg systolic / diastolic: 741/63 mmHg  Arm systolic / diastolic: 845/36 mmHG  Detailed report will be scanned into patient chart.   CMP ( most recent) CMP     Component Value Date/Time   NA 137 08/10/2021 0905   K 4.5 08/10/2021 0905   CL 100 08/10/2021 0905   CO2 22 08/10/2021 0905   GLUCOSE 213 (H) 08/10/2021 0905   GLUCOSE 103 (H) 09/11/2007 1032   BUN 12 08/10/2021 0905   CREATININE 0.72 (L) 08/10/2021 0905   CALCIUM 9.5 08/10/2021 0905   PROT 7.3 08/10/2021 0905   ALBUMIN 4.8 08/10/2021 0905   AST 29 08/10/2021 0905   ALT 58 (H) 08/10/2021 0905   ALKPHOS 100 08/10/2021 0905   BILITOT 0.4 08/10/2021 0905   GFRNONAA 109 10/23/2020 0000   GFRAA   09/11/2007 1032    >60        The eGFR has been calculated using the MDRD equation. This calculation has not been validated in all clinical     Diabetic Labs (most recent): Lab Results  Component Value Date   HGBA1C 6.4 (A) 04/23/2021   HGBA1C 7.2 10/23/2020     Lipid Panel ( most recent) Lipid Panel     Component Value Date/Time   TRIG 185 (A) 10/23/2020 0000   LDLCALC 74 10/23/2020 0000      No results found for: TSH, FREET4         Assessment & Plan:   1) Type 2 diabetes mellitus without complication, without long-term current use of insulin ([place])  He presents today with no meter or logs to review (was not asked to routinely monitor due to monotherapy with Metformin alone).  His POCT A1c today is 8%, increasing from last visit of 6.4%.  He reports morning glucose readings in the 190s recently.  Had a brief period after he had his flu shot that his glucose shot up in the mid 200s.  He also admits he has been eating poorly lately due to his busy schedule.  He denies any hypoglycemia.  He does report he took some of his left over Rybelsus which helped and did not cause him significant nausea.  - Micheal Nelson has currently uncontrolled symptomatic type 2 DM since 50 years of age.  -Recent labs reviewed.  - I had a long discussion with him about the progressive nature of diabetes and the pathology behind its complications. -his diabetes is not currently complicated but he remains at a high risk for more acute and chronic complications which include CAD, CVA, CKD, retinopathy, and neuropathy. These are all discussed in detail with him.  - Nutritional counseling repeated at each appointment due to patients tendency to fall back in to old habits.  - The patient admits there is a room for improvement in their diet and drink choices. -  Suggestion is made for the patient to avoid simple carbohydrates from their diet including Cakes, Sweet Desserts / Pastries, Ice Cream,  Soda (diet and regular), Sweet Tea, Candies, Chips, Cookies, Sweet Pastries, Store Bought Juices, Alcohol in Excess of 1-2 drinks a day, Artificial Sweeteners, Coffee Creamer, and "Sugar-free" Products. This will help patient  to have stable blood glucose profile and potentially avoid unintended weight gain.   - I encouraged the patient to switch to unprocessed or minimally processed complex starch and increased protein intake (animal or plant source), fruits, and vegetables.   - Patient is advised to stick to a routine mealtimes to eat 3 meals a day and avoid unnecessary snacks (to snack only to correct hypoglycemia).  - I have approached him with the following individualized plan to manage his diabetes and patient agrees:   -He is advised to continue Metformin 1000 mg po twice daily with meals and to formally restart his Rybelsus 7 mg po daily before breakfast to help with postprandial hyperglycemia.   He is advised to continue monitoring blood glucose at least once daily, before breakfast and to call the clinic if he has readings less than 70 or greater than 200 for 3 tests in a row.   - Specific targets for  A1c; LDL, HDL, and Triglycerides were discussed with the patient.  2) Blood Pressure /Hypertension:  his blood pressure is not controlled to target.  He had not long taken his medication prior to his visit with me today.  he is advised to continue his current medications including Lisinopril 2.5 mg p.o. daily with breakfast.  3) Lipids/Hyperlipidemia:    Review of his recent lipid panel from 10/23/20 showed controlled LDL at 74 and elevated triglycerides of 185 .  he is advised to continue Atorvastatin 40 mg daily at bedtime.  Side effects and precautions discussed with him.  Will recheck lipid panel prior to next visit  4)  Weight/Diet:  his Body mass index is 37.8 kg/m.  -  clearly complicating his diabetes care.   he is a candidate for weight loss. I discussed with him the fact that  loss of 5 - 10% of his  current body weight will have the most impact on his diabetes management.  Exercise, and detailed carbohydrates information provided  -  detailed on discharge instructions.  5) Vitamin D Deficiency: His most recent vitamin D level was 24.2 on 10/23/20.  He is not currently on any supplementation.  Will recheck Vitamin D level prior to next visit.  6) Chronic Care/Health Maintenance: -he is on ACEI/ARB and Statin medications and is encouraged to initiate and continue to follow up with Ophthalmology, Dentist, Podiatrist at least yearly or according to recommendations, and advised to stay away from smoking. I have recommended yearly flu vaccine and pneumonia vaccine at least every 5 years; moderate intensity exercise for up to 150 minutes weekly; and sleep for at least 7 hours a day.  - he is advised to maintain close follow up with Celene Squibb, MD for primary care needs, as well as his other providers for optimal and coordinated care.      I spent 36 minutes in the care of the patient today including review of labs from [place], Lipids, Thyroid Function, Hematology (current and previous including abstractions from other facilities); face-to-face time discussing  his blood glucose readings/logs, discussing hypoglycemia and hyperglycemia episodes and symptoms, medications doses, his options of short and long term treatment based on the latest standards of care / guidelines;  discussion about incorporating lifestyle medicine;  and documenting the encounter.    Please refer to Patient Instructions for Blood Glucose Monitoring and Insulin/Medications Dosing Guide"  in media tab for additional information. Please  also refer to " Patient Self Inventory" in the Media  tab for reviewed elements of pertinent patient  history.  Micheal Nelson participated in the discussions, expressed understanding, and voiced agreement with the above plans.  All questions were answered to his  satisfaction. he is encouraged to contact clinic should he have any questions or concerns prior to his return visit.     Follow up plan: - Return in about 3 months (around 11/13/2021) for Diabetes F/U- A1c and UM in office, Previsit labs, Bring meter and logs.    Rayetta Pigg, [facility] [address] [place], [place] [address] Phone: [phone] Fax: [phone]  08/13/2021, 9:01 AM

## 2021-08-19 ENCOUNTER — Ambulatory Visit (INDEPENDENT_AMBULATORY_CARE_PROVIDER_SITE_OTHER): Payer: 59

## 2021-08-19 ENCOUNTER — Other Ambulatory Visit: Payer: Self-pay

## 2021-08-19 ENCOUNTER — Other Ambulatory Visit (HOSPITAL_COMMUNITY): Payer: Self-pay

## 2021-08-19 ENCOUNTER — Ambulatory Visit
Admission: EM | Admit: 2021-08-19 | Discharge: 2021-08-19 | Disposition: A | Discharge status: Home / Self Care | Payer: 59 | Attending: Urgent Care | Admitting: Urgent Care

## 2021-08-19 ENCOUNTER — Ambulatory Visit: Payer: 59

## 2021-08-19 DIAGNOSIS — J209 Acute bronchitis, unspecified: Secondary | ICD-10-CM | POA: Diagnosis not present

## 2021-08-19 DIAGNOSIS — J069 Acute upper respiratory infection, unspecified: Secondary | ICD-10-CM

## 2021-08-19 DIAGNOSIS — R062 Wheezing: Secondary | ICD-10-CM | POA: Diagnosis not present

## 2021-08-19 DIAGNOSIS — R070 Pain in throat: Secondary | ICD-10-CM

## 2021-08-19 DIAGNOSIS — R059 Cough, unspecified: Secondary | ICD-10-CM

## 2021-08-19 DIAGNOSIS — E119 Type 2 diabetes mellitus without complications: Secondary | ICD-10-CM

## 2021-08-19 MED ORDER — PSEUDOEPHEDRINE HCL 60 MG PO TABS: 60.0000 mg | ORAL_TABLET | Freq: Three times a day (TID) | ORAL | 0 refills | Status: DC | PRN

## 2021-08-19 MED ORDER — CETIRIZINE HCL 10 MG PO TABS: 10.0000 mg | ORAL_TABLET | Freq: Every day | ORAL | 0 refills | Status: DC

## 2021-08-19 MED ORDER — PROMETHAZINE-DM 6.25-15 MG/5ML PO SYRP: 5.0000 mL | ORAL_SOLUTION | Freq: Every evening | ORAL | 0 refills | Status: DC | PRN

## 2021-08-19 MED ORDER — BENZONATATE 100 MG PO CAPS: 100.0000 mg | ORAL_CAPSULE | Freq: Three times a day (TID) | ORAL | 0 refills | Status: DC | PRN

## 2021-08-19 NOTE — ED Triage Notes (Signed)
Two day h/o cough and nasal drainage, sore throat and onset last night of fever. Tmax 100.0. Has been taking alka seltzer, tussin and otc cough syrup with some temporary relief.

## 2021-08-19 NOTE — ED Provider Notes (Signed)
[place]   MRN: [id number] DOB: 24-Jan-1971  Subjective:   Micheal Nelson is a 50 y.o. male presenting for 2-day history of acute onset sinus drainage, throat pain, coughing.  Patient felt wheezing last night as well.  No history of respiratory disorders.  No smoking history.  Patient is a type II diabetic treated without insulin.  Recently had his A1c done and was at 8%.  No sick contacts that he knows of.  No current facility-administered medications for this encounter.  Current Outpatient Medications:    atorvastatin (LIPITOR) 40 MG tablet, Take 1 tablet (40 mg total) by mouth daily., Disp: 90 tablet, Rfl: 3   Blood Glucose Monitoring Suppl (FREESTYLE LITE) w/Device KIT, 1 kit by Does not apply route once for 1 dose., Disp: 1 kit, Rfl: 0   glucose blood (FREESTYLE LITE) test strip, Use as instructed to monitor glucose once daily., Disp: 100 each, Rfl: 12   icosapent Ethyl (VASCEPA) 1 g capsule, TAKE 2 CAPSULES BY MOUTH TWO TIMES DAILY FOR HIGH TRIGLYCERIDES, Disp: 360 capsule, Rfl: 0   Lancets (FREESTYLE) lancets, Use as instructed to monitor glucose once daily, Disp: 100 each, Rfl: 12   levocetirizine (XYZAL) 5 MG tablet, Take 5 mg by mouth every evening., Disp: , Rfl:    lisinopril (ZESTRIL) 2.5 MG tablet, Take 1 tablet (2.5 mg total) by mouth daily., Disp: 90 tablet, Rfl: 2   metFORMIN (GLUCOPHAGE) 1000 MG tablet, Take 1 tablet (1,000 mg total) by mouth 2 (two) times daily with a meal., Disp: 180 tablet, Rfl: 3   Semaglutide (RYBELSUS) 7 MG TABS, Take 7 mg by mouth daily., Disp: 90 tablet, Rfl: 3   No Known Allergies  Past Medical History:  Diagnosis Date   Diabetes ([place])    High cholesterol      Past Surgical History:  Procedure Laterality Date   HERNIA REPAIR      Family History  Problem Relation Age of Onset   Healthy Mother    Healthy Father     Social History   Tobacco Use   Smoking status: Never   Smokeless tobacco: Former    Types:  Chew    Quit date: 10/18/2006  Vaping Use   Vaping Use: Never used  Substance Use Topics   Alcohol use: Never   Drug use: Never    ROS   Objective:   Vitals: BP 115/71 (BP Location: Right Arm)   Pulse 89   Temp 98.4 F (36.9 C) (Oral)   Resp 18   SpO2 96%   Physical Exam Constitutional:      General: He is not in acute distress.    Appearance: Normal appearance. He is well-developed. He is obese. He is not ill-appearing, toxic-appearing or diaphoretic.  HENT:     Head: Normocephalic and atraumatic.     Right Ear: External ear normal.     Left Ear: External ear normal.     Nose: Nose normal.     Mouth/Throat:     Mouth: Mucous membranes are moist.     Pharynx: No oropharyngeal exudate or posterior oropharyngeal erythema.  Eyes:     General: No scleral icterus.       Right eye: No discharge.        Left eye: No discharge.     Extraocular Movements: Extraocular movements intact.     Conjunctiva/sclera: Conjunctivae normal.     Pupils: Pupils are equal, round, and reactive to light.  Cardiovascular:     Rate  and Rhythm: Normal rate and regular rhythm.     Heart sounds: Normal heart sounds. No murmur heard.   No friction rub. No gallop.  Pulmonary:     Effort: Pulmonary effort is normal. No respiratory distress.     Breath sounds: Normal breath sounds. No stridor. No wheezing, rhonchi or rales.  Neurological:     Mental Status: He is alert and oriented to person, place, and time.  Psychiatric:        Mood and Affect: Mood normal.        Behavior: Behavior normal.        Thought Content: Thought content normal.        Judgment: Judgment normal.    DG Chest 2 View  Result Date: 08/19/2021 CLINICAL DATA:  Cough and wheezing. EXAM: CHEST - 2 VIEW COMPARISON:  None. FINDINGS: The cardiac silhouette, mediastinal and hilar contours are within normal limits. There is mild peribronchial thickening and slight increased interstitial markings suggesting bronchitis. No  infiltrates or effusions. The bony thorax is intact. IMPRESSION: Findings suggest bronchitis. No infiltrates or effusions. Electronically Signed   By: Marijo Sanes M.D.   On: 08/19/2021 14:41     Assessment and Plan :   PDMP not reviewed this encounter.  1. Acute bronchitis, unspecified organism   2. Viral URI with cough   3. Wheezing   4. Throat pain   5. Type 2 diabetes mellitus treated without insulin ([facility])    Has acute bronchitis is almost exclusively viral, discussed avoidance of antibiotics for now.  Recommended supportive care.  We will avoid use of prednisone given his diabetes. Counseled patient on potential for adverse effects with medications prescribed/recommended today, ER and return-to-clinic precautions discussed, patient verbalized understanding.    Jaynee Eagles, PA-C 08/19/21 1501

## 2021-08-20 LAB — COVID-19, FLU A+B NAA
Influenza A, NAA: NOT DETECTED
Influenza B, NAA: NOT DETECTED
SARS-CoV-2, NAA: NOT DETECTED

## 2021-08-24 ENCOUNTER — Other Ambulatory Visit (HOSPITAL_COMMUNITY): Payer: Self-pay

## 2021-09-09 ENCOUNTER — Other Ambulatory Visit (HOSPITAL_COMMUNITY): Payer: Self-pay

## 2021-09-09 ENCOUNTER — Other Ambulatory Visit: Payer: Self-pay | Admitting: Nurse Practitioner

## 2021-09-09 MED ORDER — ICOSAPENT ETHYL 1 G PO CAPS
2.0000 g | ORAL_CAPSULE | Freq: Two times a day (BID) | ORAL | 0 refills | Status: DC
  Filled 2021-09-09: qty 360, 90d supply, fill #0

## 2021-11-11 DIAGNOSIS — E119 Type 2 diabetes mellitus without complications: Secondary | ICD-10-CM | POA: Diagnosis not present

## 2021-11-11 DIAGNOSIS — E559 Vitamin D deficiency, unspecified: Secondary | ICD-10-CM | POA: Diagnosis not present

## 2021-11-12 LAB — VITAMIN D 25 HYDROXY (VIT D DEFICIENCY, FRACTURES): Vit D, 25-Hydroxy: 20.1 ng/mL — ABNORMAL LOW (ref 30.0–100.0)

## 2021-11-12 LAB — COMPREHENSIVE METABOLIC PANEL
ALT: 62 IU/L — ABNORMAL HIGH (ref 0–44)
AST: 38 IU/L (ref 0–40)
Albumin/Globulin Ratio: 2 (ref 1.2–2.2)
Albumin: 4.7 g/dL (ref 4.0–5.0)
Alkaline Phosphatase: 96 IU/L (ref 44–121)
BUN/Creatinine Ratio: 18 (ref 9–20)
BUN: 14 mg/dL (ref 6–24)
Bilirubin Total: 0.5 mg/dL (ref 0.0–1.2)
CO2: 22 mmol/L (ref 20–29)
Calcium: 9.3 mg/dL (ref 8.7–10.2)
Chloride: 97 mmol/L (ref 96–106)
Creatinine, Ser: 0.77 mg/dL (ref 0.76–1.27)
Globulin, Total: 2.3 g/dL (ref 1.5–4.5)
Glucose: 190 mg/dL — ABNORMAL HIGH (ref 70–99)
Potassium: 4.4 mmol/L (ref 3.5–5.2)
Sodium: 136 mmol/L (ref 134–144)
Total Protein: 7 g/dL (ref 6.0–8.5)
eGFR: 110 mL/min/{1.73_m2} (ref >59)

## 2021-11-12 LAB — LIPID PANEL
Chol/HDL Ratio: 4.6 ratio (ref 0.0–5.0)
Cholesterol, Total: 142 mg/dL (ref 100–199)
HDL: 31 mg/dL — ABNORMAL LOW (ref >39)
LDL Chol Calc (NIH): 62 mg/dL (ref 0–99)
Triglycerides: 312 mg/dL — ABNORMAL HIGH (ref 0–149)
VLDL Cholesterol Cal: 49 mg/dL — ABNORMAL HIGH (ref 5–40)

## 2021-11-12 LAB — TSH: TSH: 2.23 u[IU]/mL (ref 0.450–4.500)

## 2021-11-12 LAB — T4, FREE: Free T4: 1.14 ng/dL (ref 0.82–1.77)

## 2021-11-13 ENCOUNTER — Other Ambulatory Visit: Payer: Self-pay

## 2021-11-13 ENCOUNTER — Ambulatory Visit: Payer: 59 | Admitting: Nurse Practitioner

## 2021-11-13 ENCOUNTER — Other Ambulatory Visit (HOSPITAL_COMMUNITY): Payer: Self-pay

## 2021-11-13 ENCOUNTER — Encounter: Payer: Self-pay | Admitting: Nurse Practitioner

## 2021-11-13 VITALS — BP 114/74 | HR 74 | Ht 71.0 in | Wt 273.2 lb

## 2021-11-13 DIAGNOSIS — E782 Mixed hyperlipidemia: Secondary | ICD-10-CM

## 2021-11-13 DIAGNOSIS — I10 Essential (primary) hypertension: Secondary | ICD-10-CM

## 2021-11-13 DIAGNOSIS — E559 Vitamin D deficiency, unspecified: Secondary | ICD-10-CM | POA: Diagnosis not present

## 2021-11-13 DIAGNOSIS — E119 Type 2 diabetes mellitus without complications: Secondary | ICD-10-CM | POA: Diagnosis not present

## 2021-11-13 LAB — POCT GLYCOSYLATED HEMOGLOBIN (HGB A1C): HbA1c, POC (controlled diabetic range): 7.9 % — AB (ref 0.0–7.0)

## 2021-11-13 MED ORDER — RYBELSUS 7 MG PO TABS
7.0000 mg | ORAL_TABLET | Freq: Every day | ORAL | 3 refills | Status: DC
  Filled 2021-11-13: qty 90, 90d supply, fill #0

## 2021-11-13 NOTE — Patient Instructions (Signed)

## 2021-11-13 NOTE — Progress Notes (Signed)
Endocrinology Follow Up Note       11/13/2021, 8:50 AM   Subjective:    Patient ID: Micheal Nelson, male    DOB: [birth year].  MOATAZ TAVIS is being seen in follow up after being seen in consultation for management of currently uncontrolled symptomatic diabetes requested by  Celene Squibb, MD.   Past Medical History:  Diagnosis Date   Diabetes ([place])    High cholesterol     Past Surgical History:  Procedure Laterality Date   HERNIA REPAIR      Social History   Socioeconomic History   Marital status: Married    Spouse name: Not on file   Number of children: 4   Years of education: Not on file   Highest education level: Not on file  Occupational History   Not on file  Tobacco Use   Smoking status: Never   Smokeless tobacco: Former    Types: Chew    Quit date: 10/18/2006  Vaping Use   Vaping Use: Never used  Substance and Sexual Activity   Alcohol use: Never   Drug use: Never   Sexual activity: Not on file  Other Topics Concern   Not on file  Social History Narrative   Not on file   Social Determinants of Health   Financial Resource Strain: Not on file  Food Insecurity: Not on file  Transportation Needs: Not on file  Physical Activity: Not on file  Stress: Not on file  Social Connections: Not on file    Family History  Problem Relation Age of Onset   Healthy Mother    Healthy Father     Outpatient Encounter Medications as of 11/13/2021  Medication Sig   atorvastatin (LIPITOR) 40 MG tablet Take 1 tablet (40 mg total) by mouth daily.   cetirizine (ZYRTEC ALLERGY) 10 MG tablet Take 1 tablet (10 mg total) by mouth daily.   glucose blood (FREESTYLE LITE) test strip Use as instructed to monitor glucose once daily.   icosapent Ethyl (VASCEPA) 1 g capsule Take 2 capsules (2 g total) by mouth 2 (two) times daily for high triglyceride   Lancets (FREESTYLE) lancets Use as instructed to  monitor glucose once daily   levocetirizine (XYZAL) 5 MG tablet Take 5 mg by mouth every evening.   lisinopril (ZESTRIL) 2.5 MG tablet Take 1 tablet (2.5 mg total) by mouth daily.   metFORMIN (GLUCOPHAGE) 1000 MG tablet Take 1 tablet (1,000 mg total) by mouth 2 (two) times daily with a meal.   [DISCONTINUED] Semaglutide (RYBELSUS) 7 MG TABS Take 7 mg by mouth daily.   Blood Glucose Monitoring Suppl (FREESTYLE LITE) w/Device KIT 1 kit by Does not apply route once for 1 dose.   Semaglutide (RYBELSUS) 7 MG TABS Take 7 mg by mouth daily.   [DISCONTINUED] benzonatate (TESSALON) 100 MG capsule Take 1-2 capsules (100-200 mg total) by mouth 3 (three) times daily as needed for cough. (Patient not taking: Reported on 11/13/2021)   [DISCONTINUED] promethazine-dextromethorphan (PROMETHAZINE-DM) 6.25-15 MG/5ML syrup Take 5 mLs by mouth at bedtime as needed for cough. (Patient not taking: Reported on 11/13/2021)   [DISCONTINUED] pseudoephedrine (SUDAFED) 60 MG tablet Take 1 tablet (60  mg total) by mouth every 8 (eight) hours as needed for congestion. (Patient not taking: Reported on 11/13/2021)   No facility-administered encounter medications on file as of 11/13/2021.    ALLERGIES: No Known Allergies  VACCINATION STATUS: Immunization History  Administered Date(s) Administered   Moderna Sars-Covid-2 Vaccination 01/18/2020, 02/19/2020    Diabetes He presents for his follow-up diabetic visit. He has type 2 diabetes mellitus. Onset time: Was diagnosed at approx age of 61. His disease course has been stable. There are no hypoglycemic associated symptoms. Associated symptoms include fatigue. Pertinent negatives for diabetes include no polyuria and no weight loss. There are no hypoglycemic complications. Symptoms are stable. There are no diabetic complications. Risk factors for coronary artery disease include diabetes mellitus, dyslipidemia, family history, hypertension, male sex and obesity. Current diabetic  treatment includes oral agent (dual therapy). He is compliant with treatment most of the time. His weight is fluctuating minimally. He is following a generally healthy diet. When asked about meal planning, he reported none. He has not had a previous visit with a dietitian. He participates in exercise every other day. (He presents today with no meter or logs to review (was not asked to routinely monitor due to Metformin and Rybelsus alone).  His POCT A1c today is 7.9%, essentially unchanged from previous visit.  He did have bronchitis since last visit and he admittedly overindulged in foods during the holiday season.  He denies any s/s of hypoglycemia.) An ACE inhibitor/angiotensin II receptor blocker is being taken. He does not see a podiatrist.Eye exam is current.  Hyperlipidemia This is a chronic problem. The current episode started more than 1 year ago. The problem is uncontrolled. Recent lipid tests were reviewed and are variable. Exacerbating diseases include diabetes and obesity. Factors aggravating his hyperlipidemia include fatty foods. Current antihyperlipidemic treatment includes statins and herbal therapy. The current treatment provides moderate improvement of lipids. Compliance problems include adherence to diet and adherence to exercise.  Risk factors for coronary artery disease include diabetes mellitus, dyslipidemia, family history, hypertension, male sex and obesity.  Hypertension This is a chronic problem. The current episode started more than 1 year ago. The problem has been resolved since onset. The problem is controlled. There are no associated agents to hypertension. Risk factors for coronary artery disease include diabetes mellitus, dyslipidemia, family history, male gender and obesity. Past treatments include ACE inhibitors. The current treatment provides significant improvement. There are no compliance problems.     Review of systems  Constitutional: + Minimally fluctuating body  weight,  current Body mass index is 38.1 kg/m. , no fatigue, no subjective hyperthermia, no subjective hypothermia Eyes: no blurry vision, no xerophthalmia ENT: no sore throat, no nodules palpated in throat, no dysphagia/odynophagia, no hoarseness Cardiovascular: no chest pain, no shortness of breath, no palpitations, no leg swelling Respiratory: no cough, no shortness of breath Gastrointestinal: no nausea/vomiting/diarrhea Musculoskeletal: no muscle/joint aches Skin: no rashes, no hyperemia Neurological: no tremors, no numbness, no tingling, no dizziness Psychiatric: no depression, no anxiety  Objective:     BP 114/74    Pulse 74    Ht '5\' 11"'  (1.803 m)    Wt 273 lb 3.2 oz (123.9 kg)    SpO2 97%    BMI 38.10 kg/m   Wt Readings from Last 3 Encounters:  11/13/21 273 lb 3.2 oz (123.9 kg)  08/13/21 271 lb (122.9 kg)  05/08/21 267 lb 9.6 oz (121.4 kg)     BP Readings from Last 3 Encounters:  11/13/21 114/74  08/19/21 115/71  08/13/21 (!) 149/85     Physical Exam- Limited  Constitutional:  Body mass index is 38.1 kg/m. , not in acute distress, normal state of mind Eyes:  EOMI, no exophthalmos Neck: Supple Cardiovascular: RRR, no murmurs, rubs, or gallops, no edema Respiratory: Adequate breathing efforts, no crackles, rales, rhonchi, or wheezing Musculoskeletal: no gross deformities, strength intact in all four extremities, no gross restriction of joint movements Skin:  no rashes, no hyperemia Neurological: no tremor with outstretched hands   CMP ( most recent) CMP     Component Value Date/Time   NA 136 11/11/2021 0807   K 4.4 11/11/2021 0807   CL 97 11/11/2021 0807   CO2 22 11/11/2021 0807   GLUCOSE 190 (H) 11/11/2021 0807   GLUCOSE 103 (H) 09/11/2007 1032   BUN 14 11/11/2021 0807   CREATININE 0.77 11/11/2021 0807   CALCIUM 9.3 11/11/2021 0807   PROT 7.0 11/11/2021 0807   ALBUMIN 4.7 11/11/2021 0807   AST 38 11/11/2021 0807   ALT 62 (H) 11/11/2021 0807   ALKPHOS  96 11/11/2021 0807   BILITOT 0.5 11/11/2021 0807   GFRNONAA 109 10/23/2020 0000   GFRAA  09/11/2007 1032    >60        The eGFR has been calculated using the MDRD equation. This calculation has not been validated in all clinical     Diabetic Labs (most recent): Lab Results  Component Value Date   HGBA1C 7.9 (A) 11/13/2021   HGBA1C 8.0 (A) 08/13/2021   HGBA1C 6.4 (A) 04/23/2021     Lipid Panel ( most recent) Lipid Panel     Component Value Date/Time   CHOL 142 11/11/2021 0807   TRIG 312 (H) 11/11/2021 0807   HDL 31 (L) 11/11/2021 0807   CHOLHDL 4.6 11/11/2021 0807   LDLCALC 62 11/11/2021 0807   LABVLDL 49 (H) 11/11/2021 0807      Lab Results  Component Value Date   TSH 2.230 11/11/2021   FREET4 1.14 11/11/2021           Assessment & Plan:   1) Type 2 diabetes mellitus without complication, without long-term current use of insulin ([place])  He presents today with no meter or logs to review (was not asked to routinely monitor due to Metformin and Rybelsus alone).  His POCT A1c today is 7.9%, essentially unchanged from previous visit.  He did have bronchitis since last visit and he admittedly overindulged in foods during the holiday season.  He denies any s/s of hypoglycemia.  - KAITLIN ARDITO has currently uncontrolled symptomatic type 2 DM since 51 years of age.  -Recent labs reviewed.  - I had a long discussion with him about the progressive nature of diabetes and the pathology behind its complications. -his diabetes is not currently complicated but he remains at a high risk for more acute and chronic complications which include CAD, CVA, CKD, retinopathy, and neuropathy. These are all discussed in detail with him.  - Nutritional counseling repeated at each appointment due to patients tendency to fall back in to old habits.  - The patient admits there is a room for improvement in their diet and drink choices. -  Suggestion is made for the patient to avoid  simple carbohydrates from their diet including Cakes, Sweet Desserts / Pastries, Ice Cream, Soda (diet and regular), Sweet Tea, Candies, Chips, Cookies, Sweet Pastries, Store Bought Juices, Alcohol in Excess of 1-2 drinks a day, Artificial Sweeteners, Coffee Creamer, and "Sugar-free" Products. This  will help patient to have stable blood glucose profile and potentially avoid unintended weight gain.   - I encouraged the patient to switch to unprocessed or minimally processed complex starch and increased protein intake (animal or plant source), fruits, and vegetables.   - Patient is advised to stick to a routine mealtimes to eat 3 meals a day and avoid unnecessary snacks (to snack only to correct hypoglycemia).  - I have approached him with the following individualized plan to manage his diabetes and patient agrees:   -He is advised to continue Metformin 1000 mg po twice daily with meals and continue his Rybelsus 7 mg po daily before breakfast to help with postprandial hyperglycemia.   He will not tolerate higher dose of Rybelsus at this time due to elevated triglycerides increasing his risk of pancreatitis.  We did discuss the possibility of adding Glipizide at next visit if he is unable to control with diet and exercise and current medication regimen.  He is advised to start monitoring blood glucose at least once daily, before breakfast and to call the clinic if he has readings less than 70 or greater than 200 for 3 tests in a row.   - Specific targets for  A1c; LDL, HDL, and Triglycerides were discussed with the patient.  2) Blood Pressure /Hypertension:  his blood pressure is controlled to target.   he is advised to continue his current medications including Lisinopril 2.5 mg p.o. daily with breakfast.  3) Lipids/Hyperlipidemia:    Review of his recent lipid panel from 11/11/21 showed controlled LDL at 62 and elevated triglycerides of 312-worsening .  he is advised to continue Atorvastatin 40 mg  daily at bedtime and Vascepa 2 grams BID.  Side effects and precautions discussed with him.    4)  Weight/Diet:  his Body mass index is 38.1 kg/m.  -  clearly complicating his diabetes care.   he is a candidate for weight loss. I discussed with him the fact that loss of 5 - 10% of his  current body weight will have the most impact on his diabetes management.  Exercise, and detailed carbohydrates information provided  -  detailed on discharge instructions.  5) Vitamin D Deficiency: His most recent vitamin D level was 20.1 on 11/11/21.  He is not currently on any supplementation.  Will recheck Vitamin D level prior to next visit.  He is advised to start OTC vitamin D3 5000 units po daily.  6) Chronic Care/Health Maintenance: -he is on ACEI/ARB and Statin medications and is encouraged to initiate and continue to follow up with Ophthalmology, Dentist, Podiatrist at least yearly or according to recommendations, and advised to stay away from smoking. I have recommended yearly flu vaccine and pneumonia vaccine at least every 5 years; moderate intensity exercise for up to 150 minutes weekly; and sleep for at least 7 hours a day.  - he is advised to maintain close follow up with Celene Squibb, MD for primary care needs, as well as his other providers for optimal and coordinated care.     I spent 30 minutes in the care of the patient today including review of labs from [place], Lipids, Thyroid Function, Hematology (current and previous including abstractions from other facilities); face-to-face time discussing  his blood glucose readings/logs, discussing hypoglycemia and hyperglycemia episodes and symptoms, medications doses, his options of short and long term treatment based on the latest standards of care / guidelines;  discussion about incorporating lifestyle medicine;  and documenting the encounter.  Please refer to Patient Instructions for Blood Glucose Monitoring and Insulin/Medications Dosing Guide"  in  media tab for additional information. Please  also refer to " Patient Self Inventory" in the Media  tab for reviewed elements of pertinent patient history.  Bonne Dolores participated in the discussions, expressed understanding, and voiced agreement with the above plans.  All questions were answered to his satisfaction. he is encouraged to contact clinic should he have any questions or concerns prior to his return visit.     Follow up plan: - Return in about 3 months (around 02/11/2022) for Diabetes F/U with A1c in office, No previsit labs, Bring meter and logs.    Rayetta Pigg, [facility] [address] [place], [place] [address] Phone: [phone] Fax: [phone]  11/13/2021, 8:50 AM

## 2021-11-30 ENCOUNTER — Telehealth: Payer: 59 | Admitting: Physician Assistant

## 2021-11-30 DIAGNOSIS — J019 Acute sinusitis, unspecified: Secondary | ICD-10-CM

## 2021-11-30 DIAGNOSIS — B9689 Other specified bacterial agents as the cause of diseases classified elsewhere: Secondary | ICD-10-CM | POA: Diagnosis not present

## 2021-11-30 MED ORDER — AMOXICILLIN-POT CLAVULANATE 875-125 MG PO TABS: 1.0000 | ORAL_TABLET | Freq: Two times a day (BID) | ORAL | 0 refills | Status: DC

## 2021-11-30 NOTE — Progress Notes (Signed)

## 2021-12-23 ENCOUNTER — Telehealth: Payer: Self-pay | Admitting: Nurse Practitioner

## 2021-12-23 NOTE — Telephone Encounter (Signed)
Informed patient

## 2021-12-23 NOTE — Telephone Encounter (Signed)
Pt states he has caught the stomach bug and has been up all night, he is unable to eat anything and is not sure if he should take his medication. ? ?[phone] ?

## 2022-02-11 ENCOUNTER — Encounter: Payer: Self-pay | Admitting: Nurse Practitioner

## 2022-02-11 ENCOUNTER — Other Ambulatory Visit (HOSPITAL_COMMUNITY): Payer: Self-pay

## 2022-02-11 ENCOUNTER — Other Ambulatory Visit: Payer: Self-pay | Admitting: Nurse Practitioner

## 2022-02-11 ENCOUNTER — Ambulatory Visit: Payer: 59 | Admitting: Nurse Practitioner

## 2022-02-11 VITALS — BP 115/74 | HR 71 | Ht 71.0 in | Wt 268.0 lb

## 2022-02-11 DIAGNOSIS — E782 Mixed hyperlipidemia: Secondary | ICD-10-CM | POA: Diagnosis not present

## 2022-02-11 DIAGNOSIS — E559 Vitamin D deficiency, unspecified: Secondary | ICD-10-CM

## 2022-02-11 DIAGNOSIS — I10 Essential (primary) hypertension: Secondary | ICD-10-CM | POA: Diagnosis not present

## 2022-02-11 DIAGNOSIS — E119 Type 2 diabetes mellitus without complications: Secondary | ICD-10-CM | POA: Diagnosis not present

## 2022-02-11 LAB — POCT UA - MICROALBUMIN
Albumin/Creatinine Ratio, Urine, POC: <30
Creatinine, POC: 100 mg/dL
Microalbumin Ur, POC: 10 mg/L

## 2022-02-11 LAB — POCT GLYCOSYLATED HEMOGLOBIN (HGB A1C): HbA1c, POC (controlled diabetic range): 8.2 % — AB (ref 0.0–7.0)

## 2022-02-11 MED ORDER — LISINOPRIL 2.5 MG PO TABS
2.5000 mg | ORAL_TABLET | Freq: Every day | ORAL | 0 refills | Status: DC
  Filled 2022-02-11: qty 90, 90d supply, fill #0

## 2022-02-11 MED ORDER — RYBELSUS 14 MG PO TABS
14.0000 mg | ORAL_TABLET | Freq: Every day | ORAL | 1 refills | Status: DC
  Filled 2022-02-11: qty 90, 90d supply, fill #0
  Filled 2022-05-15: qty 90, 90d supply, fill #1

## 2022-02-11 NOTE — Progress Notes (Signed)
? ?                                                    Endocrinology Follow Up Note  ?     02/11/2022, 9:24 AM ? ? ?Subjective:  ? ? Patient ID: Micheal Nelson, male    DOB: 10/22/1970.  ?Micheal Nelson is being seen in follow up after being seen in consultation for management of currently uncontrolled symptomatic diabetes requested by  Celene Squibb, MD. ? ? ?Past Medical History:  ?Diagnosis Date  ? Diabetes ([place])   ? High cholesterol   ? ? ?Past Surgical History:  ?Procedure Laterality Date  ? HERNIA REPAIR    ? ? ?Social History  ? ?Socioeconomic History  ? Marital status: Married  ?  Spouse name: Not on file  ? Number of children: 4  ? Years of education: Not on file  ? Highest education level: Not on file  ?Occupational History  ? Not on file  ?Tobacco Use  ? Smoking status: Never  ? Smokeless tobacco: Former  ?  Types: Chew  ?  Quit date: 10/18/2006  ?Vaping Use  ? Vaping Use: Never used  ?Substance and Sexual Activity  ? Alcohol use: Never  ? Drug use: Never  ? Sexual activity: Not on file  ?Other Topics Concern  ? Not on file  ?Social History Narrative  ? Not on file  ? ?Social Determinants of Health  ? ?Financial Resource Strain: Not on file  ?Food Insecurity: Not on file  ?Transportation Needs: Not on file  ?Physical Activity: Not on file  ?Stress: Not on file  ?Social Connections: Not on file  ? ? ?Family History  ?Problem Relation Age of Onset  ? Healthy Mother   ? Healthy Father   ? ? ?Outpatient Encounter Medications as of 02/11/2022  ?Medication Sig  ? atorvastatin (LIPITOR) 40 MG tablet Take 1 tablet (40 mg total) by mouth daily.  ? glucose blood (FREESTYLE LITE) test strip Use as instructed to monitor glucose once daily.  ? icosapent Ethyl (VASCEPA) 1 g capsule Take 2 capsules (2 g total) by mouth 2 (two) times daily for high triglyceride  ? Lancets (FREESTYLE) lancets Use as instructed to monitor glucose once daily  ? levocetirizine (XYZAL) 5 MG tablet Take 5 mg by mouth every  evening.  ? metFORMIN (GLUCOPHAGE) 1000 MG tablet Take 1 tablet (1,000 mg total) by mouth 2 (two) times daily with a meal.  ? Semaglutide (RYBELSUS) 14 MG TABS Take 1 tablet (14 mg total) by mouth daily.  ? [DISCONTINUED] lisinopril (ZESTRIL) 2.5 MG tablet Take 1 tablet (2.5 mg total) by mouth daily.  ? [DISCONTINUED] Semaglutide (RYBELSUS) 7 MG TABS Take 1 tablet by mouth daily.  ? Blood Glucose Monitoring Suppl (FREESTYLE LITE) w/Device KIT 1 kit by Does not apply route once for 1 dose.  ? [DISCONTINUED] amoxicillin-clavulanate (AUGMENTIN) 875-125 MG tablet Take 1 tablet by mouth 2 (two) times daily. (Patient not taking: Reported on 02/11/2022)  ? [DISCONTINUED] cetirizine (ZYRTEC ALLERGY) 10 MG tablet Take 1 tablet (10 mg total) by mouth daily. (Patient not taking: Reported on 02/11/2022)  ? ?No facility-administered encounter medications on file as of 02/11/2022.  ? ? ?ALLERGIES: ?No Known Allergies ? ?VACCINATION STATUS: ?Immunization History  ?Administered Date(s) Administered  ? Moderna Sars-Covid-2 Vaccination 01/18/2020,  02/19/2020  ? ? ?Diabetes ?He presents for his follow-up diabetic visit. He has type 2 diabetes mellitus. Onset time: Was diagnosed at approx age of 12. His disease course has been improving. There are no hypoglycemic associated symptoms. Associated symptoms include fatigue and weight loss. Pertinent negatives for diabetes include no polyuria. There are no hypoglycemic complications. Symptoms are improving. There are no diabetic complications. Risk factors for coronary artery disease include diabetes mellitus, dyslipidemia, family history, hypertension, male sex and obesity. Current diabetic treatment includes oral agent (dual therapy). He is compliant with treatment most of the time. His weight is decreasing steadily. He is following a generally healthy diet. When asked about meal planning, he reported none. He has not had a previous visit with a dietitian. He participates in exercise every  other day. His breakfast blood glucose range is generally >200 mg/dl. His overall blood glucose range is >200 mg/dl. (He presents today with his meter, no logs, showing above target fasting glycemic profile.  His POCT A1c today is 8.2%, increasing from last visit of 7.9%.  He does admit to late night snacking, eats a light breakfast and lunch most days.  He has been incorporating more exercise in his routine.  He denies any hypoglycemia.) An ACE inhibitor/angiotensin II receptor blocker is being taken. He does not see a podiatrist.Eye exam is current.  ?Hyperlipidemia ?This is a chronic problem. The current episode started more than 1 year ago. The problem is uncontrolled. Recent lipid tests were reviewed and are variable. Exacerbating diseases include diabetes and obesity. Factors aggravating his hyperlipidemia include fatty foods. Current antihyperlipidemic treatment includes statins and herbal therapy. The current treatment provides moderate improvement of lipids. Compliance problems include adherence to diet and adherence to exercise.  Risk factors for coronary artery disease include diabetes mellitus, dyslipidemia, family history, hypertension, male sex and obesity.  ?Hypertension ?This is a chronic problem. The current episode started more than 1 year ago. The problem has been resolved since onset. The problem is controlled. There are no associated agents to hypertension. Risk factors for coronary artery disease include diabetes mellitus, dyslipidemia, family history, male gender and obesity. Past treatments include ACE inhibitors. The current treatment provides significant improvement. There are no compliance problems.   ? ? ?Review of systems ? ?Constitutional: + steadily decreasing body weight,  current Body mass index is 37.38 kg/m?. , no fatigue, no subjective hyperthermia, no subjective hypothermia ?Eyes: no blurry vision, no xerophthalmia ?ENT: no sore throat, no nodules palpated in throat, no  dysphagia/odynophagia, no hoarseness ?Cardiovascular: no chest pain, no shortness of breath, no palpitations, no leg swelling ?Respiratory: no cough, no shortness of breath ?Gastrointestinal: no nausea/vomiting/diarrhea ?Musculoskeletal: no muscle/joint aches ?Skin: no rashes, no hyperemia ?Neurological: no tremors, no numbness, no tingling, no dizziness ?Psychiatric: no depression, no anxiety ? ?Objective:  ?  ? ?BP 115/74   Pulse 71   Ht '5\' 11"'  (1.803 m)   Wt 268 lb (121.6 kg)   SpO2 97%   BMI 37.38 kg/m?   ?Wt Readings from Last 3 Encounters:  ?02/11/22 268 lb (121.6 kg)  ?11/13/21 273 lb 3.2 oz (123.9 kg)  ?08/13/21 271 lb (122.9 kg)  ?  ? ?BP Readings from Last 3 Encounters:  ?02/11/22 115/74  ?11/13/21 114/74  ?08/19/21 115/71  ?  ? ? ?Physical Exam- Limited ? ?Constitutional:  Body mass index is 37.38 kg/m?. , not in acute distress, normal state of mind ?Eyes:  EOMI, no exophthalmos ?Neck: Supple ?Cardiovascular: RRR, no murmurs, rubs, or  gallops, no edema ?Respiratory: Adequate breathing efforts, no crackles, rales, rhonchi, or wheezing ?Musculoskeletal: no gross deformities, strength intact in all four extremities, no gross restriction of joint movements ?Skin:  no rashes, no hyperemia ?Neurological: no tremor with outstretched hands ? ? ?CMP ( most recent) ?CMP  ?   ?Component Value Date/Time  ? NA 136 11/11/2021 0807  ? K 4.4 11/11/2021 0807  ? CL 97 11/11/2021 0807  ? CO2 22 11/11/2021 0807  ? GLUCOSE 190 (H) 11/11/2021 8882  ? GLUCOSE 103 (H) 09/11/2007 1032  ? BUN 14 11/11/2021 0807  ? CREATININE 0.77 11/11/2021 0807  ? CALCIUM 9.3 11/11/2021 0807  ? PROT 7.0 11/11/2021 0807  ? ALBUMIN 4.7 11/11/2021 0807  ? AST 38 11/11/2021 0807  ? ALT 62 (H) 11/11/2021 8003  ? ALKPHOS 96 11/11/2021 0807  ? BILITOT 0.5 11/11/2021 0807  ? GFRNONAA 109 10/23/2020 0000  ? GFRAA  09/11/2007 1032  ?  >60        ?The eGFR has been calculated ?using the MDRD equation. ?This calculation has not been ?validated in all  clinical  ? ? ? ?Diabetic Labs (most recent): ?Lab Results  ?Component Value Date  ? HGBA1C 8.2 (A) 02/11/2022  ? HGBA1C 7.9 (A) 11/13/2021  ? HGBA1C 8.0 (A) 08/13/2021  ? ? ? Lipid Panel ( most recent) ?Lipid Panel

## 2022-02-11 NOTE — Patient Instructions (Signed)
Diabetes Mellitus and Foot Care Foot care is an important part of your health, especially when you have diabetes. Diabetes may cause you to have problems because of poor blood flow (circulation) to your feet and legs, which can cause your skin to: Become thinner and drier. Break more easily. Heal more slowly. Peel and crack. You may also have nerve damage (neuropathy) in your legs and feet, causing decreased feeling in them. This means that you may not notice minor injuries to your feet that could lead to more serious problems. Noticing and addressing any potential problems early is the best way to prevent future foot problems. How to care for your feet Foot hygiene  Wash your feet daily with warm water and mild soap. Do not use hot water. Then, pat your feet and the areas between your toes until they are completely dry. Do not soak your feet as this can dry your skin. Trim your toenails straight across. Do not dig under them or around the cuticle. File the edges of your nails with an emery board or nail file. Apply a moisturizing lotion or petroleum jelly to the skin on your feet and to dry, brittle toenails. Use lotion that does not contain alcohol and is unscented. Do not apply lotion between your toes. Shoes and socks Wear clean socks or stockings every day. Make sure they are not too tight. Do not wear knee-high stockings since they may decrease blood flow to your legs. Wear shoes that fit properly and have enough cushioning. Always look in your shoes before you put them on to be sure there are no objects inside. To break in new shoes, wear them for just a few hours a day. This prevents injuries on your feet. Wounds, scrapes, corns, and calluses  Check your feet daily for blisters, cuts, bruises, sores, and redness. If you cannot see the bottom of your feet, use a mirror or ask someone for help. Do not cut corns or calluses or try to remove them with medicine. If you find a minor scrape,  cut, or break in the skin on your feet, keep it and the skin around it clean and dry. You may clean these areas with mild soap and water. Do not clean the area with peroxide, alcohol, or iodine. If you have a wound, scrape, corn, or callus on your foot, look at it several times a day to make sure it is healing and not infected. Check for: Redness, swelling, or pain. Fluid or blood. Warmth. Pus or a bad smell. General tips Do not cross your legs. This may decrease blood flow to your feet. Do not use heating pads or hot water bottles on your feet. They may burn your skin. If you have lost feeling in your feet or legs, you may not know this is happening until it is too late. Protect your feet from hot and cold by wearing shoes, such as at the beach or on hot pavement. Schedule a complete foot exam at least once a year (annually) or more often if you have foot problems. Report any cuts, sores, or bruises to your health care provider immediately. Where to find more information American Diabetes Association: www.diabetes.org Association of Diabetes Care & Education Specialists: www.diabeteseducator.org Contact a health care provider if: You have a medical condition that increases your risk of infection and you have any cuts, sores, or bruises on your feet. You have an injury that is not healing. You have redness on your legs or feet. You   feel burning or tingling in your legs or feet. You have pain or cramps in your legs and feet. Your legs or feet are numb. Your feet always feel cold. You have pain around any toenails. Get help right away if: You have a wound, scrape, corn, or callus on your foot and: You have pain, swelling, or redness that gets worse. You have fluid or blood coming from the wound, scrape, corn, or callus. Your wound, scrape, corn, or callus feels warm to the touch. You have pus or a bad smell coming from the wound, scrape, corn, or callus. You have a fever. You have a red  line going up your leg. Summary Check your feet every day for blisters, cuts, bruises, sores, and redness. Apply a moisturizing lotion or petroleum jelly to the skin on your feet and to dry, brittle toenails. Wear shoes that fit properly and have enough cushioning. If you have foot problems, report any cuts, sores, or bruises to your health care provider immediately. Schedule a complete foot exam at least once a year (annually) or more often if you have foot problems. This information is not intended to replace advice given to you by your health care provider. Make sure you discuss any questions you have with your health care provider. Document Revised: 04/24/2020 Document Reviewed: 04/24/2020 Elsevier Patient Education  2023 Elsevier Inc.  

## 2022-05-03 ENCOUNTER — Other Ambulatory Visit (HOSPITAL_COMMUNITY): Payer: Self-pay

## 2022-05-03 ENCOUNTER — Other Ambulatory Visit: Payer: Self-pay | Admitting: Nurse Practitioner

## 2022-05-03 DIAGNOSIS — E119 Type 2 diabetes mellitus without complications: Secondary | ICD-10-CM

## 2022-05-04 ENCOUNTER — Other Ambulatory Visit (HOSPITAL_COMMUNITY): Payer: Self-pay

## 2022-05-04 ENCOUNTER — Telehealth: Payer: Self-pay | Admitting: Nurse Practitioner

## 2022-05-04 DIAGNOSIS — E119 Type 2 diabetes mellitus without complications: Secondary | ICD-10-CM

## 2022-05-04 MED ORDER — FREESTYLE LITE TEST VI STRP
ORAL_STRIP | 1 refills | Status: DC
  Filled 2022-05-04: qty 50, 50d supply, fill #0
  Filled 2022-08-16: qty 50, 50d supply, fill #1
  Filled 2022-11-12: qty 50, 50d supply, fill #2

## 2022-05-04 NOTE — Telephone Encounter (Signed)
Rx sent 

## 2022-05-04 NOTE — Telephone Encounter (Signed)
Patient called and said he does need a refill on his test strips. He said it was denied but he is completely out. Could you send that to [facility]?

## 2022-05-05 ENCOUNTER — Other Ambulatory Visit (HOSPITAL_COMMUNITY): Payer: Self-pay

## 2022-05-07 DIAGNOSIS — E119 Type 2 diabetes mellitus without complications: Secondary | ICD-10-CM | POA: Diagnosis not present

## 2022-05-08 LAB — COMPREHENSIVE METABOLIC PANEL
ALT: 62 IU/L — ABNORMAL HIGH (ref 0–44)
AST: 29 IU/L (ref 0–40)
Albumin/Globulin Ratio: 2 (ref 1.2–2.2)
Albumin: 4.6 g/dL (ref 4.1–5.1)
Alkaline Phosphatase: 104 IU/L (ref 44–121)
BUN/Creatinine Ratio: 25 — ABNORMAL HIGH (ref 9–20)
BUN: 21 mg/dL (ref 6–24)
Bilirubin Total: 0.5 mg/dL (ref 0.0–1.2)
CO2: 24 mmol/L (ref 20–29)
Calcium: 9.6 mg/dL (ref 8.7–10.2)
Chloride: 98 mmol/L (ref 96–106)
Creatinine, Ser: 0.85 mg/dL (ref 0.76–1.27)
Globulin, Total: 2.3 g/dL (ref 1.5–4.5)
Glucose: 222 mg/dL — ABNORMAL HIGH (ref 70–99)
Potassium: 4.2 mmol/L (ref 3.5–5.2)
Sodium: 135 mmol/L (ref 134–144)
Total Protein: 6.9 g/dL (ref 6.0–8.5)
eGFR: 107 mL/min/{1.73_m2} (ref >59)

## 2022-05-10 DIAGNOSIS — E119 Type 2 diabetes mellitus without complications: Secondary | ICD-10-CM | POA: Diagnosis not present

## 2022-05-10 DIAGNOSIS — I10 Essential (primary) hypertension: Secondary | ICD-10-CM | POA: Diagnosis not present

## 2022-05-10 DIAGNOSIS — Z0001 Encounter for general adult medical examination with abnormal findings: Secondary | ICD-10-CM | POA: Diagnosis not present

## 2022-05-10 DIAGNOSIS — E782 Mixed hyperlipidemia: Secondary | ICD-10-CM | POA: Diagnosis not present

## 2022-05-10 DIAGNOSIS — E669 Obesity, unspecified: Secondary | ICD-10-CM | POA: Diagnosis not present

## 2022-05-10 DIAGNOSIS — Z6834 Body mass index (BMI) 34.0-34.9, adult: Secondary | ICD-10-CM | POA: Diagnosis not present

## 2022-05-12 DIAGNOSIS — E782 Mixed hyperlipidemia: Secondary | ICD-10-CM | POA: Diagnosis not present

## 2022-05-14 ENCOUNTER — Encounter: Payer: Self-pay | Admitting: Nurse Practitioner

## 2022-05-14 ENCOUNTER — Ambulatory Visit: Payer: 59 | Admitting: Nurse Practitioner

## 2022-05-14 VITALS — BP 119/82 | HR 76 | Ht 71.0 in | Wt 265.0 lb

## 2022-05-14 DIAGNOSIS — I10 Essential (primary) hypertension: Secondary | ICD-10-CM | POA: Diagnosis not present

## 2022-05-14 DIAGNOSIS — E559 Vitamin D deficiency, unspecified: Secondary | ICD-10-CM | POA: Diagnosis not present

## 2022-05-14 DIAGNOSIS — E782 Mixed hyperlipidemia: Secondary | ICD-10-CM | POA: Diagnosis not present

## 2022-05-14 DIAGNOSIS — E119 Type 2 diabetes mellitus without complications: Secondary | ICD-10-CM

## 2022-05-14 LAB — POCT GLYCOSYLATED HEMOGLOBIN (HGB A1C): HbA1c POC (<> result, manual entry): 8.2 % (ref 4.0–5.6)

## 2022-05-14 NOTE — Progress Notes (Signed)
Endocrinology Follow Up Note       05/14/2022, 8:52 AM   Subjective:    Patient ID: Micheal Nelson, male    DOB: [birth year].  Micheal Nelson is being seen in follow up after being seen in consultation for management of currently uncontrolled symptomatic diabetes requested by  Celene Squibb, MD.   Past Medical History:  Diagnosis Date   Diabetes ([place])    High cholesterol     Past Surgical History:  Procedure Laterality Date   HERNIA REPAIR      Social History   Socioeconomic History   Marital status: Married    Spouse name: Not on file   Number of children: 4   Years of education: Not on file   Highest education level: Not on file  Occupational History   Not on file  Tobacco Use   Smoking status: Never   Smokeless tobacco: Former    Types: Chew    Quit date: 10/18/2006  Vaping Use   Vaping Use: Never used  Substance and Sexual Activity   Alcohol use: Never   Drug use: Never   Sexual activity: Not on file  Other Topics Concern   Not on file  Social History Narrative   Not on file   Social Determinants of Health   Financial Resource Strain: Not on file  Food Insecurity: Not on file  Transportation Needs: Not on file  Physical Activity: Not on file  Stress: Not on file  Social Connections: Not on file    Family History  Problem Relation Age of Onset   Healthy Mother    Healthy Father     Outpatient Encounter Medications as of 05/14/2022  Medication Sig   atorvastatin (LIPITOR) 40 MG tablet Take 1 tablet (40 mg total) by mouth daily.   Blood Glucose Monitoring Suppl (FREESTYLE LITE) w/Device KIT 1 kit by Does not apply route once for 1 dose.   glucose blood (FREESTYLE LITE) test strip Use as instructed to monitor glucose once daily.   icosapent Ethyl (VASCEPA) 1 g capsule Take 2 capsules (2 g total) by mouth 2 (two) times daily for high triglyceride   Lancets (FREESTYLE) lancets  Use as instructed to monitor glucose once daily   levocetirizine (XYZAL) 5 MG tablet Take 5 mg by mouth every evening.   lisinopril (ZESTRIL) 2.5 MG tablet Take 1 tablet (2.5 mg total) by mouth daily.   metFORMIN (GLUCOPHAGE) 1000 MG tablet Take 1 tablet (1,000 mg total) by mouth 2 (two) times daily with a meal.   Semaglutide (RYBELSUS) 14 MG TABS Take 1 tablet (14 mg total) by mouth daily.   No facility-administered encounter medications on file as of 05/14/2022.    ALLERGIES: No Known Allergies  VACCINATION STATUS: Immunization History  Administered Date(s) Administered   Moderna Sars-Covid-2 Vaccination 01/18/2020, 02/19/2020    Diabetes He presents for his follow-up diabetic visit. He has type 2 diabetes mellitus. Onset time: Was diagnosed at approx age of 51. His disease course has been stable. There are no hypoglycemic associated symptoms. Associated symptoms include fatigue and weight loss. Pertinent negatives for diabetes include no polyuria. There are no hypoglycemic complications. Symptoms are stable. There  are no diabetic complications. Risk factors for coronary artery disease include diabetes mellitus, dyslipidemia, family history, hypertension, male sex and obesity. Current diabetic treatment includes oral agent (dual therapy). He is compliant with treatment most of the time. His weight is decreasing steadily. He is following a generally healthy diet. When asked about meal planning, he reported none. He has not had a previous visit with a dietitian. He participates in exercise every other day. His breakfast blood glucose range is generally 180-200 mg/dl. His overall blood glucose range is >200 mg/dl. (He presents today with his meter, no logs, showing increasing glycemic profile over recent weeks.  His POCT A1c today is 49%, unchanged from previous visit.  He notes he recently went on vacation which may have affected his readings some.  He is tolerating the increase in Rybelsus well.)  An ACE inhibitor/angiotensin II receptor blocker is being taken. He does not see a podiatrist.Eye exam is current.  Hyperlipidemia This is a chronic problem. The current episode started more than 1 year ago. The problem is uncontrolled. Recent lipid tests were reviewed and are variable. Exacerbating diseases include diabetes and obesity. Factors aggravating his hyperlipidemia include fatty foods. Current antihyperlipidemic treatment includes statins and herbal therapy. The current treatment provides moderate improvement of lipids. Compliance problems include adherence to diet and adherence to exercise.  Risk factors for coronary artery disease include diabetes mellitus, dyslipidemia, family history, hypertension, male sex and obesity.  Hypertension This is a chronic problem. The current episode started more than 1 year ago. The problem has been resolved since onset. The problem is controlled. There are no associated agents to hypertension. Risk factors for coronary artery disease include diabetes mellitus, dyslipidemia, family history, male gender and obesity. Past treatments include ACE inhibitors. The current treatment provides significant improvement. There are no compliance problems.      Review of systems  Constitutional: + steadily decreasing body weight,  current Body mass index is 36.96 kg/m. , no fatigue, no subjective hyperthermia, no subjective hypothermia Eyes: no blurry vision, no xerophthalmia ENT: no sore throat, no nodules palpated in throat, no dysphagia/odynophagia, no hoarseness Cardiovascular: no chest pain, no shortness of breath, no palpitations, no leg swelling Respiratory: no cough, no shortness of breath Gastrointestinal: no nausea/vomiting/diarrhea Musculoskeletal: no muscle/joint aches Skin: no rashes, no hyperemia Neurological: no tremors, no numbness, no tingling, no dizziness Psychiatric: no depression, no anxiety  Objective:     BP 119/82   Pulse 76   Ht 5'  11" (1.803 m)   Wt 265 lb (120.2 kg)   BMI 36.96 kg/m   Wt Readings from Last 3 Encounters:  05/14/22 265 lb (120.2 kg)  02/11/22 268 lb (121.6 kg)  11/13/21 273 lb 3.2 oz (123.9 kg)     BP Readings from Last 3 Encounters:  05/14/22 119/82  02/11/22 115/74  11/13/21 114/74      Physical Exam- Limited  Constitutional:  Body mass index is 36.96 kg/m. , not in acute distress, normal state of mind Eyes:  EOMI, no exophthalmos Neck: Supple Cardiovascular: RRR, no murmurs, rubs, or gallops, no edema Respiratory: Adequate breathing efforts, no crackles, rales, rhonchi, or wheezing Musculoskeletal: no gross deformities, strength intact in all four extremities, no gross restriction of joint movements Skin:  no rashes, no hyperemia Neurological: no tremor with outstretched hands   CMP ( most recent) CMP     Component Value Date/Time   NA 135 05/07/2022 0823   K 4.2 05/07/2022 0823   CL 98 05/07/2022 0823  CO2 24 05/07/2022 0823   GLUCOSE 222 (H) 05/07/2022 0823   GLUCOSE 103 (H) 09/11/2007 1032   BUN 21 05/07/2022 0823   CREATININE 0.85 05/07/2022 0823   CALCIUM 9.6 05/07/2022 0823   PROT 6.9 05/07/2022 0823   ALBUMIN 4.6 05/07/2022 0823   AST 29 05/07/2022 0823   ALT 62 (H) 05/07/2022 0823   ALKPHOS 104 05/07/2022 0823   BILITOT 0.5 05/07/2022 0823   GFRNONAA 109 10/23/2020 0000   GFRAA  09/11/2007 1032    >60        The eGFR has been calculated using the MDRD equation. This calculation has not been validated in all clinical     Diabetic Labs (most recent): Lab Results  Component Value Date   HGBA1C 8.2 05/14/2022   HGBA1C 8.2 (A) 02/11/2022   HGBA1C 7.9 (A) 11/13/2021   MICROALBUR 10 02/11/2022   MICROALBUR 5 10/23/2020     Lipid Panel ( most recent) Lipid Panel     Component Value Date/Time   CHOL 142 11/11/2021 0807   TRIG 312 (H) 11/11/2021 0807   HDL 31 (L) 11/11/2021 0807   CHOLHDL 4.6 11/11/2021 0807   LDLCALC 62 11/11/2021 0807   LABVLDL  49 (H) 11/11/2021 0807      Lab Results  Component Value Date   TSH 2.230 11/11/2021   FREET4 1.14 11/11/2021           Assessment & Plan:   1) Type 2 diabetes mellitus without complication, without long-term current use of insulin ([place])  He presents today with his meter, no logs, showing increasing glycemic profile over recent weeks.  His POCT A1c today is 49%, unchanged from previous visit.  He notes he recently went on vacation which may have affected his readings some.  He is tolerating the increase in Rybelsus well.  - TOMIO KIRK has currently uncontrolled symptomatic type 2 DM since 51 years of age.  -Recent labs reviewed.  - I had a long discussion with him about the progressive nature of diabetes and the pathology behind its complications. -his diabetes is not currently complicated but he remains at a high risk for more acute and chronic complications which include CAD, CVA, CKD, retinopathy, and neuropathy. These are all discussed in detail with him.  The following Lifestyle Medicine recommendations according to [facility]) were discussed and offered to patient and he agrees to start the journey:  A. Whole Foods, Plant-based plate comprising of fruits and vegetables, plant-based proteins, whole-grain carbohydrates was discussed in detail with the patient.   A list for source of those nutrients were also provided to the patient.  Patient will use only water or unsweetened tea for hydration. B.  The need to stay away from risky substances including alcohol, smoking; obtaining 7 to 9 hours of restorative sleep, at least 150 minutes of moderate intensity exercise weekly, the importance of healthy social connections,  and stress reduction techniques were discussed. C.  A full color page of  Calorie density of various food groups per pound showing examples of each food groups was provided to the patient.  - Nutritional counseling repeated  at each appointment due to patients tendency to fall back in to old habits.  - The patient admits there is a room for improvement in their diet and drink choices. -  Suggestion is made for the patient to avoid simple carbohydrates from their diet including Cakes, Sweet Desserts / Pastries, Ice Cream, Soda (diet and regular),  Sweet Tea, Candies, Chips, Cookies, Sweet Pastries, Store Bought Juices, Alcohol in Excess of 1-2 drinks a day, Artificial Sweeteners, Coffee Creamer, and "Sugar-free" Products. This will help patient to have stable blood glucose profile and potentially avoid unintended weight gain.   - I encouraged the patient to switch to unprocessed or minimally processed complex starch and increased protein intake (animal or plant source), fruits, and vegetables.   - Patient is advised to stick to a routine mealtimes to eat 3 meals a day and avoid unnecessary snacks (to snack only to correct hypoglycemia).  - I have approached him with the following individualized plan to manage his diabetes and patient agrees:   -He is advised to continue Metformin 1000 mg po twice daily with meals and Rybelsus 14 mg po daily before breakfast.  He is advised to continue monitoring blood glucose at least once daily, before breakfast and to call the clinic if he has readings less than 70 or greater than 200 for 3 tests in a row.   - Specific targets for  A1c; LDL, HDL, and Triglycerides were discussed with the patient.  2) Blood Pressure /Hypertension:  his blood pressure is controlled to target.   he is advised to continue his current medications including Lisinopril 2.5 mg p.o. daily with breakfast.  3) Lipids/Hyperlipidemia:    Review of his recent lipid panel from 11/11/21 showed controlled LDL at 62 and elevated triglycerides of 312-worsening .  he is advised to continue Atorvastatin 40 mg daily at bedtime and Vascepa 2 grams BID.  Side effects and precautions discussed with him.    4)  Weight/Diet:   his Body mass index is 36.96 kg/m.  -  clearly complicating his diabetes care.   he is a candidate for weight loss. I discussed with him the fact that loss of 5 - 10% of his  current body weight will have the most impact on his diabetes management.  Exercise, and detailed carbohydrates information provided  -  detailed on discharge instructions.  5) Vitamin D Deficiency: His most recent vitamin D level was 20.1 on 11/11/21.  He is advised to continue OTC vitamin D3 5000 units po daily.  6) Chronic Care/Health Maintenance: -he is on ACEI/ARB and Statin medications and is encouraged to initiate and continue to follow up with Ophthalmology, Dentist, Podiatrist at least yearly or according to recommendations, and advised to stay away from smoking. I have recommended yearly flu vaccine and pneumonia vaccine at least every 5 years; moderate intensity exercise for up to 150 minutes weekly; and sleep for at least 7 hours a day.  - he is advised to maintain close follow up with Celene Squibb, MD for primary care needs, as well as his other providers for optimal and coordinated care.       I spent 30 minutes in the care of the patient today including review of labs from [place], Lipids, Thyroid Function, Hematology (current and previous including abstractions from other facilities); face-to-face time discussing  his blood glucose readings/logs, discussing hypoglycemia and hyperglycemia episodes and symptoms, medications doses, his options of short and long term treatment based on the latest standards of care / guidelines;  discussion about incorporating lifestyle medicine;  and documenting the encounter. Risk reduction counseling performed per USPSTF guidelines to reduce obesity and cardiovascular risk factors.     Please refer to Patient Instructions for Blood Glucose Monitoring and Insulin/Medications Dosing Guide"  in media tab for additional information. Please  also refer to " Patient  Self Inventory" in the  Media  tab for reviewed elements of pertinent patient history.  Bonne Dolores participated in the discussions, expressed understanding, and voiced agreement with the above plans.  All questions were answered to his satisfaction. he is encouraged to contact clinic should he have any questions or concerns prior to his return visit.     Follow up plan: - Return in about 3 months (around 08/14/2022) for Diabetes F/U with A1c in office, No previsit labs, Bring meter and logs.   Rayetta Pigg, [facility] [address] [place], [place] [address] Phone: [phone] Fax: [phone]  05/14/2022, 8:52 AM

## 2022-05-15 ENCOUNTER — Other Ambulatory Visit (HOSPITAL_COMMUNITY): Payer: Self-pay

## 2022-05-15 ENCOUNTER — Other Ambulatory Visit: Payer: Self-pay | Admitting: Nurse Practitioner

## 2022-05-17 ENCOUNTER — Other Ambulatory Visit (HOSPITAL_COMMUNITY): Payer: Self-pay

## 2022-05-17 MED ORDER — ATORVASTATIN CALCIUM 40 MG PO TABS
40.0000 mg | ORAL_TABLET | Freq: Every day | ORAL | 3 refills | Status: DC
  Filled 2022-05-17: qty 90, 90d supply, fill #0
  Filled 2022-08-16: qty 90, 90d supply, fill #1
  Filled 2022-11-12: qty 90, 90d supply, fill #2

## 2022-05-17 MED ORDER — LISINOPRIL 2.5 MG PO TABS
2.5000 mg | ORAL_TABLET | Freq: Every day | ORAL | 0 refills | Status: DC
Start: 2022-05-17 — End: 2022-08-16
  Filled 2022-05-17: qty 90, 90d supply, fill #0

## 2022-05-17 MED ORDER — ICOSAPENT ETHYL 1 G PO CAPS
2.0000 g | ORAL_CAPSULE | Freq: Two times a day (BID) | ORAL | 0 refills | Status: DC
  Filled 2022-05-17 (×3): qty 360, 90d supply, fill #0

## 2022-05-17 MED ORDER — METFORMIN HCL 1000 MG PO TABS
1000.0000 mg | ORAL_TABLET | Freq: Two times a day (BID) | ORAL | 3 refills | Status: DC
  Filled 2022-05-17: qty 180, 90d supply, fill #0
  Filled 2022-08-16: qty 180, 90d supply, fill #1
  Filled 2022-11-12: qty 180, 90d supply, fill #2

## 2022-05-18 ENCOUNTER — Other Ambulatory Visit (HOSPITAL_COMMUNITY): Payer: Self-pay

## 2022-05-19 ENCOUNTER — Other Ambulatory Visit (HOSPITAL_COMMUNITY): Payer: Self-pay

## 2022-06-10 DIAGNOSIS — H6121 Impacted cerumen, right ear: Secondary | ICD-10-CM | POA: Diagnosis not present

## 2022-07-21 ENCOUNTER — Ambulatory Visit
Admission: RE | Admit: 2022-07-21 | Discharge: 2022-07-21 | Disposition: A | Discharge status: Home / Self Care | Payer: 59 | Source: Ambulatory Visit | Attending: Nurse Practitioner | Admitting: Nurse Practitioner

## 2022-07-21 ENCOUNTER — Other Ambulatory Visit: Payer: Self-pay

## 2022-07-21 VITALS — BP 126/81 | HR 110 | Temp 99.7°F | Resp 20

## 2022-07-21 DIAGNOSIS — J069 Acute upper respiratory infection, unspecified: Secondary | ICD-10-CM | POA: Diagnosis not present

## 2022-07-21 DIAGNOSIS — Z1152 Encounter for screening for COVID-19: Secondary | ICD-10-CM | POA: Insufficient documentation

## 2022-07-21 LAB — RESP PANEL BY RT-PCR (FLU A&B, COVID) ARPGX2
Influenza A by PCR: NEGATIVE
Influenza B by PCR: NEGATIVE
SARS Coronavirus 2 by RT PCR: POSITIVE — AB

## 2022-07-21 MED ORDER — CETIRIZINE-PSEUDOEPHEDRINE ER 5-120 MG PO TB12: 1.0000 | ORAL_TABLET | Freq: Every day | ORAL | 0 refills | Status: AC

## 2022-07-21 MED ORDER — PROMETHAZINE-DM 6.25-15 MG/5ML PO SYRP: 5.0000 mL | ORAL_SOLUTION | Freq: Four times a day (QID) | ORAL | 0 refills | Status: DC | PRN

## 2022-07-21 MED ORDER — FLUTICASONE PROPIONATE 50 MCG/ACT NA SUSP: 2.0000 | Freq: Every day | NASAL | 0 refills | Status: DC

## 2022-07-21 NOTE — Discharge Instructions (Addendum)
COVID/flu test is pending at this time.  As discussed, if your COVID test is positive, you are a candidate to receive molnupiravir.  You will not have to stop your cholesterol medication if you take this medication.  If your flu test is positive, you are also a candidate to receive Tamiflu.  You will be contacted if your results are positive. Take medication as directed. Continue your current allergy medication regimen. Increase fluids and get plenty of rest. May take over-the-counter ibuprofen or Tylenol as needed for pain, fever, or general discomfort. Recommend normal saline nasal spray to help with nasal congestion throughout the day. For your cough, it may be helpful to use a humidifier at bedtime during sleep. Sleep elevated on 2 pillows to help with cough symptoms persist. As discussed, if your symptoms fail to improve over the next 7 to 10 days or suddenly worsen, please follow-up with your primary care physician for further evaluation.

## 2022-07-21 NOTE — ED Provider Notes (Signed)
[facility]    CSN: [id number] Arrival date & time: 07/21/22  1143      History   Chief Complaint Chief Complaint  Patient presents with   Cough    Cough, congestion, body aches, chills, sinus pressure - Entered by patient    HPI Micheal Nelson is a 51 y.o. male.   The history is provided by the patient.   Patient presents with a 1 day history of cough, congestion, body aches, chills, and sinus pressure.  Patient also informs that he has had a low-grade temperature around 99 since his symptoms started.  He denies fever, sore throat, wheezing, shortness of breath, or difficulty breathing.  He states that the cough is worse at night and has been productive of clear sputum.  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  He states that he has been taking over-the-counter cough medicine for his symptoms.  Past Medical History:  Diagnosis Date   Diabetes ([place])    High cholesterol     Patient Active Problem List   Diagnosis Date Noted   Pain in joint of right ankle 03/07/2018    Past Surgical History:  Procedure Laterality Date   HERNIA REPAIR         Home Medications    Prior to Admission medications   Medication Sig Start Date End Date Taking? Authorizing Provider  cetirizine-pseudoephedrine (ZYRTEC-D) 5-120 MG tablet Take 1 tablet by mouth daily. 07/21/22  Yes Harrol Novello-Warren, Alda Lea, NP  fluticasone (FLONASE) 50 MCG/ACT nasal spray Place 2 sprays into both nostrils daily. 07/21/22  Yes Darionna Banke-Warren, Alda Lea, NP  promethazine-dextromethorphan (PROMETHAZINE-DM) 6.25-15 MG/5ML syrup Take 5 mLs by mouth 4 (four) times daily as needed for cough. 07/21/22  Yes Stephane Niemann-Warren, Alda Lea, NP  atorvastatin (LIPITOR) 40 MG tablet Take 1 tablet (40 mg total) by mouth daily. 05/17/22   Brita Romp, NP  Blood Glucose Monitoring Suppl (FREESTYLE LITE) w/Device KIT 1 kit by Does not apply route once for 1 dose.  05/04/21 05/04/21  Brita Romp, NP  glucose blood (FREESTYLE LITE) test strip Use as instructed to monitor glucose once daily. 05/04/22   Brita Romp, NP  icosapent Ethyl (VASCEPA) 1 g capsule Take 2 capsules (2 g total) by mouth 2 (two) times daily for high triglyceride 05/17/22   Brita Romp, NP  Lancets (FREESTYLE) lancets Use as instructed to monitor glucose once daily 05/04/21   Brita Romp, NP  levocetirizine (XYZAL) 5 MG tablet Take 5 mg by mouth every evening.    [provider]  lisinopril (ZESTRIL) 2.5 MG tablet Take 1 tablet (2.5 mg total) by mouth daily. 05/17/22     metFORMIN (GLUCOPHAGE) 1000 MG tablet Take 1 tablet (1,000 mg total) by mouth 2 (two) times daily with a meal. 05/17/22   Reardon, Juanetta Beets, NP  Semaglutide (RYBELSUS) 14 MG TABS Take 1 tablet (14 mg total) by mouth daily. 02/11/22   Brita Romp, NP    Family History Family History  Problem Relation Age of Onset   Healthy Mother    Healthy Father     Social History Social History   Tobacco Use   Smoking status: Never   Smokeless tobacco: Former    Types: Chew    Quit date: 10/18/2006  Vaping Use   Vaping Use: Never used  Substance Use Topics   Alcohol use: Never   Drug use: Never  Allergies   Patient has no known allergies.   Review of Systems Review of Systems Per HPI  Physical Exam Triage Vital Signs ED Triage Vitals  Enc Vitals Group     BP 07/21/22 1203 126/81     Pulse Rate 07/21/22 1203 (!) 110     Resp 07/21/22 1203 20     Temp 07/21/22 1203 99.7 F (37.6 C)     Temp Source 07/21/22 1203 Oral     SpO2 07/21/22 1203 96 %     Weight --      Height --      Head Circumference --      Peak Flow --      Pain Score 07/21/22 1204 5     Pain Loc --      Pain Edu? --      Excl. in [place]? --    No data found.  Updated Vital Signs BP 126/81 (BP Location: Right Arm)   Pulse (!) 110   Temp 99.7 F (37.6 C) (Oral)   Resp 20   SpO2 96%   Visual  Acuity Right Eye Distance:   Left Eye Distance:   Bilateral Distance:    Right Eye Near:   Left Eye Near:    Bilateral Near:     Physical Exam Vitals and nursing note reviewed.  Constitutional:      Appearance: Normal appearance. He is well-developed.  HENT:     Head: Normocephalic and atraumatic.     Right Ear: Tympanic membrane, ear canal and external ear normal.     Left Ear: Tympanic membrane, ear canal and external ear normal.     Nose: Congestion present.     Right Turbinates: Enlarged and swollen.     Left Turbinates: Enlarged and swollen.     Right Sinus: No maxillary sinus tenderness or frontal sinus tenderness.     Left Sinus: No maxillary sinus tenderness or frontal sinus tenderness.     Mouth/Throat:     Lips: Pink.     Mouth: Mucous membranes are moist.     Pharynx: Oropharynx is clear. Uvula midline. No pharyngeal swelling, oropharyngeal exudate, posterior oropharyngeal erythema or uvula swelling.     Tonsils: No tonsillar exudate.  Eyes:     Extraocular Movements: Extraocular movements intact.     Conjunctiva/sclera: Conjunctivae normal.     Pupils: Pupils are equal, round, and reactive to light.  Neck:     Thyroid: No thyromegaly.     Trachea: No tracheal deviation.  Cardiovascular:     Rate and Rhythm: Normal rate and regular rhythm.     Heart sounds: Normal heart sounds.  Pulmonary:     Effort: Pulmonary effort is normal.     Breath sounds: Normal breath sounds.  Abdominal:     General: Bowel sounds are normal. There is no distension.     Palpations: Abdomen is soft.     Tenderness: There is no abdominal tenderness.  Musculoskeletal:     Cervical back: Normal range of motion and neck supple.  Skin:    General: Skin is warm and dry.  Neurological:     General: No focal deficit present.     Mental Status: He is alert and oriented to person, place, and time.  Psychiatric:        Mood and Affect: Mood normal.        Behavior: Behavior normal.         Thought Content: Thought content normal.  Judgment: Judgment normal.      UC Treatments / Results  Labs (all labs ordered are listed, but only abnormal results are displayed) Labs Reviewed  RESP PANEL BY RT-PCR (FLU A&B, COVID) [id number]    EKG   Radiology No results found.  Procedures Procedures (including critical care time)  Medications Ordered in UC Medications - No data to display  Initial Impression / Assessment and Plan / UC Course  I have reviewed the triage vital signs and the nursing notes.  Pertinent labs & imaging results that were available during my care of the patient were reviewed by me and considered in my medical decision making (see chart for details).  Patient presents with a 24-hour history of upper respiratory symptoms.  On exam, patient's vital signs are stable, although he is mildly tachycardic, he is in no acute distress.  Symptoms are consistent with a viral upper respiratory infection at this time, pending the COVID/flu test.  Patient would like to receive Tamiflu if his flu test is positive or molnupiravir if the COVID test is positive.  Patient was given symptomatic treatment to include Promethazine DM, fluticasone, and Zyrtec-D.  Patient was advised to continue his current allergy medication at this time.  Supportive care recommendations were provided to the patient along with indications of when to follow-up with his primary care physician.  Patient verbalizes understanding.  All questions were answered. Final Clinical Impressions(s) / UC Diagnoses   Final diagnoses:  Encounter for screening for COVID-19  Acute upper respiratory infection     Discharge Instructions      COVID/flu test is pending at this time.  As discussed, if your COVID test is positive, you are a candidate to receive molnupiravir.  You will not have to stop your cholesterol medication if you take this medication.  If your flu test is positive, you are also a candidate to  receive Tamiflu.  You will be contacted if your results are positive. Take medication as directed. Continue your current allergy medication regimen. Increase fluids and get plenty of rest. May take over-the-counter ibuprofen or Tylenol as needed for pain, fever, or general discomfort. Recommend normal saline nasal spray to help with nasal congestion throughout the day. For your cough, it may be helpful to use a humidifier at bedtime during sleep. Sleep elevated on 2 pillows to help with cough symptoms persist. As discussed, if your symptoms fail to improve over the next 7 to 10 days or suddenly worsen, please follow-up with your primary care physician for further evaluation.     ED Prescriptions     Medication Sig Dispense Auth. Provider   cetirizine-pseudoephedrine (ZYRTEC-D) 5-120 MG tablet Take 1 tablet by mouth daily. 30 tablet Aileena Iglesia-Warren, Alda Lea, NP   fluticasone (FLONASE) 50 MCG/ACT nasal spray Place 2 sprays into both nostrils daily. 16 g Jeyden Coffelt-Warren, Alda Lea, NP   promethazine-dextromethorphan (PROMETHAZINE-DM) 6.25-15 MG/5ML syrup Take 5 mLs by mouth 4 (four) times daily as needed for cough. 140 mL Tomika Eckles-Warren, Alda Lea, NP      PDMP not reviewed this encounter.   Tish Men, NP 07/21/22 1230

## 2022-07-21 NOTE — ED Triage Notes (Addendum)
Pt reports cough, headache, body aches, runny nose, facial pain since yesterday. Reports low grade fever at home.

## 2022-07-22 ENCOUNTER — Telehealth (HOSPITAL_COMMUNITY): Payer: Self-pay | Admitting: Emergency Medicine

## 2022-07-22 ENCOUNTER — Other Ambulatory Visit (HOSPITAL_COMMUNITY): Payer: Self-pay

## 2022-07-22 MED ORDER — MOLNUPIRAVIR EUA 200MG CAPSULE
4.0000 | ORAL_CAPSULE | Freq: Two times a day (BID) | ORAL | 0 refills | Status: AC
  Filled 2022-07-22: qty 40, 5d supply, fill #0

## 2022-08-15 ENCOUNTER — Other Ambulatory Visit (HOSPITAL_COMMUNITY): Payer: Self-pay

## 2022-08-16 ENCOUNTER — Other Ambulatory Visit (HOSPITAL_COMMUNITY): Payer: Self-pay

## 2022-08-16 ENCOUNTER — Encounter: Payer: Self-pay | Admitting: Nurse Practitioner

## 2022-08-16 ENCOUNTER — Ambulatory Visit: Payer: 59 | Admitting: Nurse Practitioner

## 2022-08-16 VITALS — BP 118/70 | HR 78 | Ht 71.0 in | Wt 261.2 lb

## 2022-08-16 DIAGNOSIS — E782 Mixed hyperlipidemia: Secondary | ICD-10-CM | POA: Diagnosis not present

## 2022-08-16 DIAGNOSIS — I10 Essential (primary) hypertension: Secondary | ICD-10-CM

## 2022-08-16 DIAGNOSIS — E559 Vitamin D deficiency, unspecified: Secondary | ICD-10-CM

## 2022-08-16 DIAGNOSIS — E119 Type 2 diabetes mellitus without complications: Secondary | ICD-10-CM

## 2022-08-16 LAB — POCT GLYCOSYLATED HEMOGLOBIN (HGB A1C): Hemoglobin A1C: 8.5 % — AB (ref 4.0–5.6)

## 2022-08-16 MED ORDER — LISINOPRIL 2.5 MG PO TABS
2.5000 mg | ORAL_TABLET | Freq: Every day | ORAL | 3 refills | Status: DC
  Filled 2022-08-16: qty 90, 90d supply, fill #0
  Filled 2022-11-12: qty 90, 90d supply, fill #1
  Filled 2023-02-13: qty 90, 90d supply, fill #2
  Filled 2023-05-14: qty 90, 90d supply, fill #3

## 2022-08-16 MED ORDER — RYBELSUS 14 MG PO TABS
14.0000 mg | ORAL_TABLET | Freq: Every day | ORAL | 3 refills | Status: DC
Start: 2022-08-16 — End: 2023-06-21
  Filled 2022-08-16: qty 90, 90d supply, fill #0
  Filled 2022-11-12: qty 90, 90d supply, fill #1
  Filled 2023-02-17: qty 90, 90d supply, fill #2
  Filled 2023-05-14: qty 90, 90d supply, fill #3

## 2022-08-16 MED ORDER — LISINOPRIL 2.5 MG PO TABS
2.5000 mg | ORAL_TABLET | Freq: Every day | ORAL | 0 refills | Status: DC
  Filled 2022-08-16: qty 90, 90d supply, fill #0

## 2022-08-16 MED ORDER — GLIPIZIDE ER 5 MG PO TB24
5.0000 mg | ORAL_TABLET | Freq: Every day | ORAL | 3 refills | Status: DC
  Filled 2022-08-16: qty 90, 90d supply, fill #0
  Filled 2022-11-12: qty 90, 90d supply, fill #1

## 2022-08-16 NOTE — Progress Notes (Signed)
Endocrinology Follow Up Note       08/16/2022, 8:53 AM   Subjective:    Patient ID: Micheal Nelson, male    DOB: 1971/04/25.  Micheal Nelson is being seen in follow up after being seen in consultation for management of currently uncontrolled symptomatic diabetes requested by  Celene Squibb, MD.   Past Medical History:  Diagnosis Date   Diabetes ([place])    High cholesterol     Past Surgical History:  Procedure Laterality Date   HERNIA REPAIR      Social History   Socioeconomic History   Marital status: Married    Spouse name: Not on file   Number of children: 4   Years of education: Not on file   Highest education level: Not on file  Occupational History   Not on file  Tobacco Use   Smoking status: Never   Smokeless tobacco: Former    Types: Chew    Quit date: 10/18/2006  Vaping Use   Vaping Use: Never used  Substance and Sexual Activity   Alcohol use: Never   Drug use: Never   Sexual activity: Not on file  Other Topics Concern   Not on file  Social History Narrative   Not on file   Social Determinants of Health   Financial Resource Strain: Not on file  Food Insecurity: Not on file  Transportation Needs: Not on file  Physical Activity: Not on file  Stress: Not on file  Social Connections: Not on file    Family History  Problem Relation Age of Onset   Healthy Mother    Healthy Father     Outpatient Encounter Medications as of 08/16/2022  Medication Sig   atorvastatin (LIPITOR) 40 MG tablet Take 1 tablet (40 mg total) by mouth daily.   cetirizine-pseudoephedrine (ZYRTEC-D) 5-120 MG tablet Take 1 tablet by mouth daily.   fluticasone (FLONASE) 50 MCG/ACT nasal spray Place 2 sprays into both nostrils daily.   glipiZIDE (GLUCOTROL XL) 5 MG 24 hr tablet Take 1 tablet (5 mg total) by mouth daily with breakfast.   glucose blood (FREESTYLE LITE) test strip Use as instructed to monitor  glucose once daily.   icosapent Ethyl (VASCEPA) 1 g capsule Take 2 capsules (2 g total) by mouth 2 (two) times daily for high triglyceride   Lancets (FREESTYLE) lancets Use as instructed to monitor glucose once daily   levocetirizine (XYZAL) 5 MG tablet Take 5 mg by mouth every evening.   metFORMIN (GLUCOPHAGE) 1000 MG tablet Take 1 tablet (1,000 mg total) by mouth 2 (two) times daily with a meal.   promethazine-dextromethorphan (PROMETHAZINE-DM) 6.25-15 MG/5ML syrup Take 5 mLs by mouth 4 (four) times daily as needed for cough.   [DISCONTINUED] lisinopril (ZESTRIL) 2.5 MG tablet Take 1 tablet (2.5 mg total) by mouth daily.   [DISCONTINUED] Semaglutide (RYBELSUS) 14 MG TABS Take 1 tablet (14 mg total) by mouth daily.   Blood Glucose Monitoring Suppl (FREESTYLE LITE) w/Device KIT 1 kit by Does not apply route once for 1 dose.   lisinopril (ZESTRIL) 2.5 MG tablet Take 1 tablet (2.5 mg total) by mouth daily.   Semaglutide (RYBELSUS) 14 MG TABS Take  1 tablet (14 mg total) by mouth daily.   [DISCONTINUED] lisinopril (ZESTRIL) 2.5 MG tablet Take 1 tablet (2.5 mg total) by mouth daily.   No facility-administered encounter medications on file as of 08/16/2022.    ALLERGIES: No Known Allergies  VACCINATION STATUS: Immunization History  Administered Date(s) Administered   Moderna Sars-Covid-2 Vaccination 01/18/2020, 02/19/2020    Diabetes He presents for his follow-up diabetic visit. He has type 2 diabetes mellitus. Onset time: Was diagnosed at approx age of 28. His disease course has been stable. There are no hypoglycemic associated symptoms. Associated symptoms include fatigue and weight loss. Pertinent negatives for diabetes include no polyuria. There are no hypoglycemic complications. Symptoms are stable. There are no diabetic complications. Risk factors for coronary artery disease include diabetes mellitus, dyslipidemia, family history, hypertension, male sex and obesity. Current diabetic  treatment includes oral agent (dual therapy). He is compliant with treatment most of the time. His weight is decreasing steadily. He is following a generally healthy diet. When asked about meal planning, he reported none. He has not had a previous visit with a dietitian. He participates in exercise every other day. His home blood glucose trend is fluctuating minimally. His breakfast blood glucose range is generally 180-200 mg/dl. His overall blood glucose range is >200 mg/dl. (He presents today with his meter, no logs, showing increasing glycemic profile over recent weeks.  His POCT A1c today is 8.5%, increasing slightly from last visit.  Analysis of his meter shows 7-day average of 200, 14-day average of 200, 30-day average of 194.  He denies any hypoglycemia.  He does note he had COVID at the beginning of the month and has not yet gotten back on track with diet and exercise.) An ACE inhibitor/angiotensin II receptor blocker is being taken. He does not see a podiatrist.Eye exam is current.  Hyperlipidemia This is a chronic problem. The current episode started more than 1 year ago. The problem is uncontrolled. Recent lipid tests were reviewed and are variable. Exacerbating diseases include diabetes and obesity. Factors aggravating his hyperlipidemia include fatty foods. Current antihyperlipidemic treatment includes statins and herbal therapy. The current treatment provides moderate improvement of lipids. Compliance problems include adherence to diet and adherence to exercise.  Risk factors for coronary artery disease include diabetes mellitus, dyslipidemia, family history, hypertension, male sex and obesity.  Hypertension This is a chronic problem. The current episode started more than 1 year ago. The problem has been resolved since onset. The problem is controlled. There are no associated agents to hypertension. Risk factors for coronary artery disease include diabetes mellitus, dyslipidemia, family history,  male gender and obesity. Past treatments include ACE inhibitors. The current treatment provides significant improvement. There are no compliance problems.      Review of systems  Constitutional: + steadily decreasing body weight,  current Body mass index is 36.43 kg/m. , no fatigue, no subjective hyperthermia, no subjective hypothermia Eyes: no blurry vision, no xerophthalmia ENT: no sore throat, no nodules palpated in throat, no dysphagia/odynophagia, no hoarseness Cardiovascular: no chest pain, no shortness of breath, no palpitations, no leg swelling Respiratory: + cough (recent COVID infection), no shortness of breath Gastrointestinal: no nausea/vomiting/diarrhea Musculoskeletal: no muscle/joint aches Skin: no rashes, no hyperemia Neurological: no tremors, no numbness, no tingling, no dizziness Psychiatric: no depression, no anxiety  Objective:     BP 118/70 (BP Location: Left Arm, Patient Position: Sitting, Cuff Size: Large)   Pulse 78   Ht _0  (1.803 m)   Wt 261 lb  3.2 oz (118.5 kg)   BMI 36.43 kg/m   Wt Readings from Last 3 Encounters:  08/16/22 261 lb 3.2 oz (118.5 kg)  05/14/22 265 lb (120.2 kg)  02/11/22 268 lb (121.6 kg)     BP Readings from Last 3 Encounters:  08/16/22 118/70  07/21/22 126/81  05/14/22 119/82      Physical Exam- Limited  Constitutional:  Body mass index is 36.43 kg/m. , not in acute distress, normal state of mind Eyes:  EOMI, no exophthalmos Neck: Supple Cardiovascular: RRR, no murmurs, rubs, or gallops, no edema Respiratory: Adequate breathing efforts, no crackles, rales, rhonchi, or wheezing Musculoskeletal: no gross deformities, strength intact in all four extremities, no gross restriction of joint movements Skin:  no rashes, no hyperemia Neurological: no tremor with outstretched hands   CMP ( most recent) CMP     Component Value Date/Time   NA 135 05/07/2022 0823   K 4.2 05/07/2022 0823   CL 98 05/07/2022 0823   CO2 24  05/07/2022 0823   GLUCOSE 222 (H) 05/07/2022 0823   GLUCOSE 103 (H) 09/11/2007 1032   BUN 21 05/07/2022 0823   CREATININE 0.85 05/07/2022 0823   CALCIUM 9.6 05/07/2022 0823   PROT 6.9 05/07/2022 0823   ALBUMIN 4.6 05/07/2022 0823   AST 29 05/07/2022 0823   ALT 62 (H) 05/07/2022 0823   ALKPHOS 104 05/07/2022 0823   BILITOT 0.5 05/07/2022 0823   GFRNONAA 109 10/23/2020 0000   GFRAA  09/11/2007 1032    >60        The eGFR has been calculated using the MDRD equation. This calculation has not been validated in all clinical     Diabetic Labs (most recent): Lab Results  Component Value Date   HGBA1C 8.5 (A) 08/16/2022   HGBA1C 8.2 05/14/2022   HGBA1C 8.2 (A) 02/11/2022   MICROALBUR 10 02/11/2022   MICROALBUR 5 10/23/2020     Lipid Panel ( most recent) Lipid Panel     Component Value Date/Time   CHOL 142 11/11/2021 0807   TRIG 312 (H) 11/11/2021 0807   HDL 31 (L) 11/11/2021 0807   CHOLHDL 4.6 11/11/2021 0807   LDLCALC 62 11/11/2021 0807   LABVLDL 49 (H) 11/11/2021 0807      Lab Results  Component Value Date   TSH 2.230 11/11/2021   FREET4 1.14 11/11/2021           Assessment & Plan:   1) Type 2 diabetes mellitus without complication, without long-term current use of insulin ([place])  He presents today with his meter, no logs, showing increasing glycemic profile over recent weeks.  His POCT A1c today is 8.5%, increasing slightly from last visit.  Analysis of his meter shows 7-day average of 200, 14-day average of 200, 30-day average of 194.  He denies any hypoglycemia.  He does note he had COVID at the beginning of the month and has not yet gotten back on track with diet and exercise.  - YONAH TANGEMAN has currently uncontrolled symptomatic type 2 DM since 52 years of age.  -Recent labs reviewed.  - I had a long discussion with him about the progressive nature of diabetes and the pathology behind its complications. -his diabetes is not currently complicated  but he remains at a high risk for more acute and chronic complications which include CAD, CVA, CKD, retinopathy, and neuropathy. These are all discussed in detail with him.  The following Lifestyle Medicine recommendations according to [facility] of Lifestyle Medicine (  ACLM) were discussed and offered to patient and he agrees to start the journey:  A. Whole Foods, Plant-based plate comprising of fruits and vegetables, plant-based proteins, whole-grain carbohydrates was discussed in detail with the patient.   A list for source of those nutrients were also provided to the patient.  Patient will use only water or unsweetened tea for hydration. B.  The need to stay away from risky substances including alcohol, smoking; obtaining 7 to 9 hours of restorative sleep, at least 150 minutes of moderate intensity exercise weekly, the importance of healthy social connections,  and stress reduction techniques were discussed. C.  A full color page of  Calorie density of various food groups per pound showing examples of each food groups was provided to the patient.  - Nutritional counseling repeated at each appointment due to patients tendency to fall back in to old habits.  - The patient admits there is a room for improvement in their diet and drink choices. -  Suggestion is made for the patient to avoid simple carbohydrates from their diet including Cakes, Sweet Desserts / Pastries, Ice Cream, Soda (diet and regular), Sweet Tea, Candies, Chips, Cookies, Sweet Pastries, Store Bought Juices, Alcohol in Excess of 1-2 drinks a day, Artificial Sweeteners, Coffee Creamer, and "Sugar-free" Products. This will help patient to have stable blood glucose profile and potentially avoid unintended weight gain.   - I encouraged the patient to switch to unprocessed or minimally processed complex starch and increased protein intake (animal or plant source), fruits, and vegetables.   - Patient is advised to stick to a routine  mealtimes to eat 3 meals a day and avoid unnecessary snacks (to snack only to correct hypoglycemia).  - I have approached him with the following individualized plan to manage his diabetes and patient agrees:   -He is advised to continue Metformin 1000 mg po twice daily with meals and Rybelsus 14 mg po daily before breakfast.  I did add Glipizide 5 mg XL daily with breakfast.  He is advised to continue monitoring blood glucose at least once daily, before breakfast and to call the clinic if he has readings less than 70 or greater than 200 for 3 tests in a row.   - Specific targets for  A1c; LDL, HDL, and Triglycerides were discussed with the patient.  2) Blood Pressure /Hypertension:  his blood pressure is controlled to target.   he is advised to continue his current medications including Lisinopril 2.5 mg p.o. daily with breakfast.  3) Lipids/Hyperlipidemia:    Review of his recent lipid panel from 11/11/21 showed controlled LDL at 62 and elevated triglycerides of 312-worsening .  he is advised to continue Atorvastatin 40 mg daily at bedtime and Vascepa 2 grams BID.  Side effects and precautions discussed with him.    4)  Weight/Diet:  his Body mass index is 36.43 kg/m.  -  clearly complicating his diabetes care.   he is a candidate for weight loss. I discussed with him the fact that loss of 5 - 10% of his  current body weight will have the most impact on his diabetes management.  Exercise, and detailed carbohydrates information provided  -  detailed on discharge instructions.  5) Vitamin D Deficiency: His most recent vitamin D level was 20.1 on 11/11/21.  He is advised to continue OTC vitamin D3 5000 units po daily.  6) Chronic Care/Health Maintenance: -he is on ACEI/ARB and Statin medications and is encouraged to initiate and continue to follow  up with Ophthalmology, Dentist, Podiatrist at least yearly or according to recommendations, and advised to stay away from smoking. I have recommended  yearly flu vaccine and pneumonia vaccine at least every 5 years; moderate intensity exercise for up to 150 minutes weekly; and sleep for at least 7 hours a day.  - he is advised to maintain close follow up with Celene Squibb, MD for primary care needs, as well as his other providers for optimal and coordinated care.       I spent 32 minutes in the care of the patient today including review of labs from [place], Lipids, Thyroid Function, Hematology (current and previous including abstractions from other facilities); face-to-face time discussing  his blood glucose readings/logs, discussing hypoglycemia and hyperglycemia episodes and symptoms, medications doses, his options of short and long term treatment based on the latest standards of care / guidelines;  discussion about incorporating lifestyle medicine;  and documenting the encounter. Risk reduction counseling performed per USPSTF guidelines to reduce obesity and cardiovascular risk factors.     Please refer to Patient Instructions for Blood Glucose Monitoring and Insulin/Medications Dosing Guide"  in media tab for additional information. Please  also refer to " Patient Self Inventory" in the Media  tab for reviewed elements of pertinent patient history.  Bonne Dolores participated in the discussions, expressed understanding, and voiced agreement with the above plans.  All questions were answered to his satisfaction. he is encouraged to contact clinic should he have any questions or concerns prior to his return visit.     Follow up plan: - Return in about 3 months (around 11/16/2022) for Diabetes F/U with A1c in office, No previsit labs, Bring meter and logs.  Rayetta Pigg, [facility] [address] [place], [place] [address] Phone: [phone] Fax: [phone]  08/16/2022, 8:53 AM

## 2022-10-21 ENCOUNTER — Telehealth: Payer: Commercial Managed Care - PPO | Admitting: Physician Assistant

## 2022-10-21 DIAGNOSIS — R052 Subacute cough: Secondary | ICD-10-CM

## 2022-10-21 MED ORDER — AZITHROMYCIN 250 MG PO TABS: ORAL_TABLET | ORAL | 0 refills | Status: AC

## 2022-10-21 MED ORDER — OMEPRAZOLE 20 MG PO CPDR: 20.0000 mg | DELAYED_RELEASE_CAPSULE | Freq: Two times a day (BID) | ORAL | 0 refills | Status: DC

## 2022-10-21 NOTE — Progress Notes (Signed)
We are sorry that you are not feeling well.  Here is how we plan to help!  Based on your presentation I believe you most likely have A cough due to reflux. To lessen this cough you can use the over-the counter cough medication Delsym but it is very important that we control the cause of the cough.  I suggest that you begin Prilosec 20 mg twice a day for 2 weeks.  You should see the cough lessen quickly as your reflux ( which may be silent or without burning ) is treated. I will also prescribe Azithromycin (Zpack) for possible secondary bacterial infection. However, with your history I feel you may have more what we refer to as post viral cough syndrome. This is normally secondary to residual inflammation from a significant upper respiratory infection that can cause reflux and post nasal drainage that can exacerbate the coughing.    In addition you may use A non-prescription cough medication called Robitussin DAC. Take 2 teaspoons every 8 hours or Delsym: take 2 teaspoons every 12 hours. and A prescription cough medication called Tessalon Perles 100mg . You may take 1-2 capsules every 8 hours as needed for your cough.  I have not prescribed prednisone in your case at this time since your last A1c was elevated at 8.5.  From your responses in the eVisit questionnaire you describe inflammation in the upper respiratory tract which is causing a significant cough.  This is commonly called Bronchitis and has four common causes:   Allergies Viral Infections Acid Reflux Bacterial Infection Allergies, viruses and acid reflux are treated by controlling symptoms or eliminating the cause. An example might be a cough caused by taking certain blood pressure medications. You stop the cough by changing the medication. Another example might be a cough caused by acid reflux. Controlling the reflux helps control the cough.  USE OF BRONCHODILATOR ("RESCUE") INHALERS: There is a risk from using your bronchodilator too  frequently.  The risk is that over-reliance on a medication which only relaxes the muscles surrounding the breathing tubes can reduce the effectiveness of medications prescribed to reduce swelling and congestion of the tubes themselves.  Although you feel brief relief from the bronchodilator inhaler, your asthma may actually be worsening with the tubes becoming more swollen and filled with mucus.  This can delay other crucial treatments, such as oral steroid medications. If you need to use a bronchodilator inhaler daily, several times per day, you should discuss this with your provider.  There are probably better treatments that could be used to keep your asthma under control.     HOME CARE Only take medications as instructed by your medical team. Complete the entire course of an antibiotic. Drink plenty of fluids and get plenty of rest. Avoid close contacts especially the very young and the elderly Cover your mouth if you cough or cough into your sleeve. Always remember to wash your hands A steam or ultrasonic humidifier can help congestion.   GET HELP RIGHT AWAY IF: You develop worsening fever. You become short of breath You cough up blood. Your symptoms persist after you have completed your treatment plan MAKE SURE YOU  Understand these instructions. Will watch your condition. Will get help right away if you are not doing well or get worse.    Thank you for choosing an e-visit.  Your e-visit answers were reviewed by a board certified advanced clinical practitioner to complete your personal care plan. Depending upon the condition, your plan could have included  both over the counter or prescription medications.  Please review your pharmacy choice. Make sure the pharmacy is open so you can pick up prescription now. If there is a problem, you may contact your provider through [employer] and have the prescription routed to another pharmacy.  Your safety is important to [place]. If you have  drug allergies check your prescription carefully.   For the next 24 hours you can use MyChart to ask questions about today's visit, request a non-urgent call back, or ask for a work or school excuse. You will get an email in the next two days asking about your experience. I hope that your e-visit has been valuable and will speed your recovery.  I have spent 5 minutes in review of e-visit questionnaire, review and updating patient chart, medical decision making and response to patient.   Mar Daring, PA-C

## 2022-11-12 ENCOUNTER — Other Ambulatory Visit: Payer: Self-pay

## 2022-11-17 ENCOUNTER — Ambulatory Visit: Payer: Commercial Managed Care - PPO | Admitting: Nurse Practitioner

## 2022-11-17 ENCOUNTER — Encounter: Payer: Self-pay | Admitting: Nurse Practitioner

## 2022-11-17 VITALS — BP 120/76 | HR 83 | Ht 71.0 in | Wt 271.2 lb

## 2022-11-17 DIAGNOSIS — E782 Mixed hyperlipidemia: Secondary | ICD-10-CM

## 2022-11-17 DIAGNOSIS — I10 Essential (primary) hypertension: Secondary | ICD-10-CM

## 2022-11-17 DIAGNOSIS — E119 Type 2 diabetes mellitus without complications: Secondary | ICD-10-CM

## 2022-11-17 DIAGNOSIS — E559 Vitamin D deficiency, unspecified: Secondary | ICD-10-CM

## 2022-11-17 LAB — POCT GLYCOSYLATED HEMOGLOBIN (HGB A1C): Hemoglobin A1C: 7.5 % — AB (ref 4.0–5.6)

## 2022-11-17 NOTE — Progress Notes (Signed)
Endocrinology Follow Up Note       11/17/2022, 8:58 AM   Subjective:    Patient ID: Micheal Nelson, male    DOB: [birth year].  Micheal Nelson is being seen in follow up after being seen in consultation for management of currently uncontrolled symptomatic diabetes requested by  Celene Squibb, MD.   Past Medical History:  Diagnosis Date   Diabetes ([place])    High cholesterol     Past Surgical History:  Procedure Laterality Date   HERNIA REPAIR      Social History   Socioeconomic History   Marital status: Married    Spouse name: Not on file   Number of children: 4   Years of education: Not on file   Highest education level: Not on file  Occupational History   Not on file  Tobacco Use   Smoking status: Never   Smokeless tobacco: Former    Types: Chew    Quit date: 10/18/2006  Vaping Use   Vaping Use: Never used  Substance and Sexual Activity   Alcohol use: Never   Drug use: Never   Sexual activity: Not on file  Other Topics Concern   Not on file  Social History Narrative   Not on file   Social Determinants of Health   Financial Resource Strain: Not on file  Food Insecurity: Not on file  Transportation Needs: Not on file  Physical Activity: Not on file  Stress: Not on file  Social Connections: Not on file    Family History  Problem Relation Age of Onset   Healthy Mother    Healthy Father     Outpatient Encounter Medications as of 11/17/2022  Medication Sig   atorvastatin (LIPITOR) 40 MG tablet Take 1 tablet (40 mg total) by mouth daily.   fluticasone (FLONASE) 50 MCG/ACT nasal spray Place 2 sprays into both nostrils daily.   glipiZIDE (GLUCOTROL XL) 5 MG 24 hr tablet Take 1 tablet (5 mg total) by mouth daily with breakfast.   glucose blood (FREESTYLE LITE) test strip Use as instructed to monitor glucose once daily.   icosapent Ethyl (VASCEPA) 1 g capsule Take 2 capsules (2 g total)  by mouth 2 (two) times daily for high triglyceride   Lancets (FREESTYLE) lancets Use as instructed to monitor glucose once daily   levocetirizine (XYZAL) 5 MG tablet Take 5 mg by mouth every evening.   lisinopril (ZESTRIL) 2.5 MG tablet Take 1 tablet (2.5 mg total) by mouth daily.   metFORMIN (GLUCOPHAGE) 1000 MG tablet Take 1 tablet (1,000 mg total) by mouth 2 (two) times daily with a meal.   omeprazole (PRILOSEC) 20 MG capsule Take 1 capsule (20 mg total) by mouth 2 (two) times daily before a meal.   promethazine-dextromethorphan (PROMETHAZINE-DM) 6.25-15 MG/5ML syrup Take 5 mLs by mouth 4 (four) times daily as needed for cough.   Semaglutide (RYBELSUS) 14 MG TABS Take 1 tablet (14 mg total) by mouth daily.   Blood Glucose Monitoring Suppl (FREESTYLE LITE) w/Device KIT 1 kit by Does not apply route once for 1 dose.   cetirizine-pseudoephedrine (ZYRTEC-D) 5-120 MG tablet Take 1 tablet by mouth daily.   No facility-administered  encounter medications on file as of 11/17/2022.    ALLERGIES: No Known Allergies  VACCINATION STATUS: Immunization History  Administered Date(s) Administered   Moderna Sars-Covid-2 Vaccination 01/18/2020, 02/19/2020    Diabetes He presents for his follow-up diabetic visit. He has type 2 diabetes mellitus. Onset time: Was diagnosed at approx age of 52. His disease course has been improving. There are no hypoglycemic associated symptoms. Associated symptoms include fatigue. Pertinent negatives for diabetes include no polyuria and no weight loss. There are no hypoglycemic complications. Symptoms are stable. There are no diabetic complications. Risk factors for coronary artery disease include diabetes mellitus, dyslipidemia, family history, hypertension, male sex and obesity. Current diabetic treatment includes oral agent (triple therapy). He is compliant with treatment most of the time. His weight is increasing steadily. He is following a generally healthy diet. When asked  about meal planning, he reported none. He has not had a previous visit with a dietitian. He participates in exercise every other day. His home blood glucose trend is fluctuating minimally. His overall blood glucose range is 140-180 mg/dl. (He presents today with his meter, no logs, showing slightly above target glycemic profile.  His POCT A1c today is 7.5%, improving from last visit of 8.5%.  He does note he recently got back from a cruise and he admits to indulging in more food than he typically does.  He also notes a lingering cough from COVID infection in October.  He has been treating with OTC meds which may be holding him back as well.  Analysis of his meter shows 7-day average of 170, 14-day average of 160, 30-day average of 163.) An ACE inhibitor/angiotensin II receptor blocker is being taken. He does not see a podiatrist.Eye exam is current.  Hyperlipidemia This is a chronic problem. The current episode started more than 1 year ago. The problem is uncontrolled. Recent lipid tests were reviewed and are variable. Exacerbating diseases include diabetes and obesity. Factors aggravating his hyperlipidemia include fatty foods. Current antihyperlipidemic treatment includes statins and herbal therapy. The current treatment provides moderate improvement of lipids. Compliance problems include adherence to diet and adherence to exercise.  Risk factors for coronary artery disease include diabetes mellitus, dyslipidemia, family history, hypertension, male sex and obesity.  Hypertension This is a chronic problem. The current episode started more than 1 year ago. The problem has been resolved since onset. The problem is controlled. There are no associated agents to hypertension. Risk factors for coronary artery disease include diabetes mellitus, dyslipidemia, family history, male gender and obesity. Past treatments include ACE inhibitors. The current treatment provides significant improvement. There are no compliance  problems.      Review of systems  Constitutional: + increasing body weight (recently went on a cruise),  current Body mass index is 37.82 kg/m. , no fatigue, no subjective hyperthermia, no subjective hypothermia Eyes: no blurry vision, no xerophthalmia ENT: no sore throat, no nodules palpated in throat, no dysphagia/odynophagia, no hoarseness Cardiovascular: no chest pain, no shortness of breath, no palpitations, no leg swelling Respiratory: + cough- ongoing since October when he had COVID, no shortness of breath Gastrointestinal: no nausea/vomiting/diarrhea Musculoskeletal: no muscle/joint aches Skin: no rashes, no hyperemia Neurological: no tremors, no numbness, no tingling, no dizziness Psychiatric: no depression, no anxiety  Objective:     BP 120/76 (BP Location: Left Arm, Patient Position: Sitting, Cuff Size: Large)   Pulse 83   Ht 5\' 11"  (1.803 m)   Wt 271 lb 3.2 oz (123 kg)   BMI 37.82  kg/m   Wt Readings from Last 3 Encounters:  11/17/22 271 lb 3.2 oz (123 kg)  08/16/22 261 lb 3.2 oz (118.5 kg)  05/14/22 265 lb (120.2 kg)     BP Readings from Last 3 Encounters:  11/17/22 120/76  08/16/22 118/70  07/21/22 126/81     Physical Exam- Limited  Constitutional:  Body mass index is 37.82 kg/m. , not in acute distress, normal state of mind Eyes:  EOMI, no exophthalmos Neck: Supple Musculoskeletal: no gross deformities, strength intact in all four extremities, no gross restriction of joint movements Skin:  no rashes, no hyperemia Neurological: no tremor with outstretched hands   CMP ( most recent) CMP     Component Value Date/Time   NA 135 05/07/2022 0823   K 4.2 05/07/2022 0823   CL 98 05/07/2022 0823   CO2 24 05/07/2022 0823   GLUCOSE 222 (H) 05/07/2022 0823   GLUCOSE 103 (H) 09/11/2007 1032   BUN 21 05/07/2022 0823   CREATININE 0.85 05/07/2022 0823   CALCIUM 9.6 05/07/2022 0823   PROT 6.9 05/07/2022 0823   ALBUMIN 4.6 05/07/2022 0823   AST 29  05/07/2022 0823   ALT 62 (H) 05/07/2022 0823   ALKPHOS 104 05/07/2022 0823   BILITOT 0.5 05/07/2022 0823   GFRNONAA 109 10/23/2020 0000   GFRAA  09/11/2007 1032    >60        The eGFR has been calculated using the MDRD equation. This calculation has not been validated in all clinical     Diabetic Labs (most recent): Lab Results  Component Value Date   HGBA1C 7.5 (A) 11/17/2022   HGBA1C 8.5 (A) 08/16/2022   HGBA1C 8.2 05/14/2022   MICROALBUR 10 02/11/2022   MICROALBUR 5 10/23/2020     Lipid Panel ( most recent) Lipid Panel     Component Value Date/Time   CHOL 142 11/11/2021 0807   TRIG 312 (H) 11/11/2021 0807   HDL 31 (L) 11/11/2021 0807   CHOLHDL 4.6 11/11/2021 0807   LDLCALC 62 11/11/2021 0807   LABVLDL 49 (H) 11/11/2021 0807      Lab Results  Component Value Date   TSH 2.230 11/11/2021   FREET4 1.14 11/11/2021           Assessment & Plan:   1) Type 2 diabetes mellitus without complication, without long-term current use of insulin ([place])  He presents today with his meter, no logs, showing slightly above target glycemic profile.  His POCT A1c today is 7.5%, improving from last visit of 8.5%.  He does note he recently got back from a cruise and he admits to indulging in more food than he typically does.  He also notes a lingering cough from COVID infection in October.  He has been treating with OTC meds which may be holding him back as well.  Analysis of his meter shows 7-day average of 170, 14-day average of 160, 30-day average of 163.  - Micheal Nelson has currently uncontrolled symptomatic type 2 DM since 52 years of age.  -Recent labs reviewed.  - I had a long discussion with him about the progressive nature of diabetes and the pathology behind its complications. -his diabetes is not currently complicated but he remains at a high risk for more acute and chronic complications which include CAD, CVA, CKD, retinopathy, and neuropathy. These are all  discussed in detail with him.  The following Lifestyle Medicine recommendations according to [facility]) were discussed and offered  to patient and he agrees to start the journey:  A. Whole Foods, Plant-based plate comprising of fruits and vegetables, plant-based proteins, whole-grain carbohydrates was discussed in detail with the patient.   A list for source of those nutrients were also provided to the patient.  Patient will use only water or unsweetened tea for hydration. B.  The need to stay away from risky substances including alcohol, smoking; obtaining 7 to 9 hours of restorative sleep, at least 150 minutes of moderate intensity exercise weekly, the importance of healthy social connections,  and stress reduction techniques were discussed. C.  A full color page of  Calorie density of various food groups per pound showing examples of each food groups was provided to the patient.  - Nutritional counseling repeated at each appointment due to patients tendency to fall back in to old habits.  - The patient admits there is a room for improvement in their diet and drink choices. -  Suggestion is made for the patient to avoid simple carbohydrates from their diet including Cakes, Sweet Desserts / Pastries, Ice Cream, Soda (diet and regular), Sweet Tea, Candies, Chips, Cookies, Sweet Pastries, Store Bought Juices, Alcohol in Excess of 1-2 drinks a day, Artificial Sweeteners, Coffee Creamer, and "Sugar-free" Products. This will help patient to have stable blood glucose profile and potentially avoid unintended weight gain.   - I encouraged the patient to switch to unprocessed or minimally processed complex starch and increased protein intake (animal or plant source), fruits, and vegetables.   - Patient is advised to stick to a routine mealtimes to eat 3 meals a day and avoid unnecessary snacks (to snack only to correct hypoglycemia).  - I have approached him with the  following individualized plan to manage his diabetes and patient agrees:   -He is advised to continue Metformin 1000 mg po twice daily with meals, Rybelsus 14 mg po daily before breakfast, and Glipizide 5 mg XL daily with breakfast.  He is advised to continue monitoring blood glucose at least once daily, before breakfast and to call the clinic if he has readings less than 70 or greater than 200 for 3 tests in a row.   - Specific targets for  A1c; LDL, HDL, and Triglycerides were discussed with the patient.  2) Blood Pressure /Hypertension:  his blood pressure is controlled to target.   he is advised to continue his current medications including Lisinopril 2.5 mg p.o. daily with breakfast.  3) Lipids/Hyperlipidemia:    Review of his recent lipid panel from 11/11/21 showed controlled LDL at 62 and elevated triglycerides of 312-worsening .  he is advised to continue Atorvastatin 40 mg daily at bedtime and Vascepa 2 grams BID.  Side effects and precautions discussed with him. Will recheck lipid panel prior to next visit.  4)  Weight/Diet:  his Body mass index is 37.82 kg/m.  -  clearly complicating his diabetes care.   he is a candidate for weight loss. I discussed with him the fact that loss of 5 - 10% of his  current body weight will have the most impact on his diabetes management.  Exercise, and detailed carbohydrates information provided  -  detailed on discharge instructions.  5) Vitamin D Deficiency: His most recent vitamin D level was 20.1 on 11/11/21.  He is advised to continue OTC vitamin D3 5000 units po daily.  Will recheck vitamin D prior to next visit.  6) Chronic Care/Health Maintenance: -he is on ACEI/ARB and Statin medications and is encouraged  to initiate and continue to follow up with Ophthalmology, Dentist, Podiatrist at least yearly or according to recommendations, and advised to stay away from smoking. I have recommended yearly flu vaccine and pneumonia vaccine at least every 5  years; moderate intensity exercise for up to 150 minutes weekly; and sleep for at least 7 hours a day.  - he is advised to maintain close follow up with Benita Stabile, MD for primary care needs, as well as his other providers for optimal and coordinated care.      I spent 30 minutes in the care of the patient today including review of labs from CMP, Lipids, Thyroid Function, Hematology (current and previous including abstractions from other facilities); face-to-face time discussing  his blood glucose readings/logs, discussing hypoglycemia and hyperglycemia episodes and symptoms, medications doses, his options of short and long term treatment based on the latest standards of care / guidelines;  discussion about incorporating lifestyle medicine;  and documenting the encounter. Risk reduction counseling performed per USPSTF guidelines to reduce obesity and cardiovascular risk factors.     Please refer to Patient Instructions for Blood Glucose Monitoring and Insulin/Medications Dosing Guide"  in media tab for additional information. Please  also refer to " Patient Self Inventory" in the Media  tab for reviewed elements of pertinent patient history.  Lamonte Sakai participated in the discussions, expressed understanding, and voiced agreement with the above plans.  All questions were answered to his satisfaction. he is encouraged to contact clinic should he have any questions or concerns prior to his return visit.     Follow up plan: - Return in about 3 months (around 02/15/2023) for Diabetes F/U with A1c in office, Previsit labs, Bring meter and logs.  Ronny Bacon, [facility] [address] [place], [place] [address] Phone: [phone] Fax: [phone]  11/17/2022, 8:58 AM

## 2022-11-17 NOTE — Patient Instructions (Signed)

## 2023-02-11 DIAGNOSIS — E119 Type 2 diabetes mellitus without complications: Secondary | ICD-10-CM | POA: Diagnosis not present

## 2023-02-11 DIAGNOSIS — E559 Vitamin D deficiency, unspecified: Secondary | ICD-10-CM | POA: Diagnosis not present

## 2023-02-12 LAB — COMPREHENSIVE METABOLIC PANEL
ALT: 26 IU/L (ref 0–44)
AST: 15 IU/L (ref 0–40)
Albumin/Globulin Ratio: 2 (ref 1.2–2.2)
Albumin: 4.6 g/dL (ref 4.1–5.1)
Alkaline Phosphatase: 94 IU/L (ref 44–121)
BUN/Creatinine Ratio: 19 (ref 9–20)
BUN: 15 mg/dL (ref 6–24)
Bilirubin Total: 0.5 mg/dL (ref 0.0–1.2)
CO2: 23 mmol/L (ref 20–29)
Calcium: 9.4 mg/dL (ref 8.7–10.2)
Chloride: 102 mmol/L (ref 96–106)
Creatinine, Ser: 0.8 mg/dL (ref 0.76–1.27)
Globulin, Total: 2.3 g/dL (ref 1.5–4.5)
Glucose: 108 mg/dL — ABNORMAL HIGH (ref 70–99)
Potassium: 4.2 mmol/L (ref 3.5–5.2)
Sodium: 140 mmol/L (ref 134–144)
Total Protein: 6.9 g/dL (ref 6.0–8.5)
eGFR: 108 mL/min/{1.73_m2} (ref >59)

## 2023-02-12 LAB — VITAMIN D 25 HYDROXY (VIT D DEFICIENCY, FRACTURES): Vit D, 25-Hydroxy: 29.8 ng/mL — ABNORMAL LOW (ref 30.0–100.0)

## 2023-02-12 LAB — LIPID PANEL
Chol/HDL Ratio: 2.8 ratio (ref 0.0–5.0)
Cholesterol, Total: 101 mg/dL (ref 100–199)
HDL: 36 mg/dL — ABNORMAL LOW (ref >39)
LDL Chol Calc (NIH): 48 mg/dL (ref 0–99)
Triglycerides: 86 mg/dL (ref 0–149)
VLDL Cholesterol Cal: 17 mg/dL (ref 5–40)

## 2023-02-12 LAB — TSH: TSH: 1.31 u[IU]/mL (ref 0.450–4.500)

## 2023-02-12 LAB — T4, FREE: Free T4: 1.16 ng/dL (ref 0.82–1.77)

## 2023-02-14 ENCOUNTER — Other Ambulatory Visit (HOSPITAL_COMMUNITY): Payer: Self-pay

## 2023-02-16 ENCOUNTER — Ambulatory Visit: Payer: Commercial Managed Care - PPO | Admitting: Nurse Practitioner

## 2023-02-16 ENCOUNTER — Encounter: Payer: Self-pay | Admitting: Nurse Practitioner

## 2023-02-16 VITALS — BP 118/70 | HR 70 | Ht 71.0 in | Wt 239.2 lb

## 2023-02-16 DIAGNOSIS — E782 Mixed hyperlipidemia: Secondary | ICD-10-CM | POA: Diagnosis not present

## 2023-02-16 DIAGNOSIS — I10 Essential (primary) hypertension: Secondary | ICD-10-CM | POA: Diagnosis not present

## 2023-02-16 DIAGNOSIS — E119 Type 2 diabetes mellitus without complications: Secondary | ICD-10-CM | POA: Diagnosis not present

## 2023-02-16 DIAGNOSIS — E559 Vitamin D deficiency, unspecified: Secondary | ICD-10-CM

## 2023-02-16 LAB — POCT GLYCOSYLATED HEMOGLOBIN (HGB A1C): Hemoglobin A1C: 5.5 % (ref 4.0–5.6)

## 2023-02-16 LAB — POCT UA - MICROALBUMIN
Creatinine, POC: 50 mg/dL
Microalbumin Ur, POC: 10 mg/L

## 2023-02-16 MED ORDER — METFORMIN HCL 1000 MG PO TABS: 500.0000 mg | ORAL_TABLET | Freq: Two times a day (BID) | ORAL | 3 refills | Status: DC

## 2023-02-16 MED ORDER — ATORVASTATIN CALCIUM 40 MG PO TABS: 20.0000 mg | ORAL_TABLET | Freq: Every day | ORAL | 3 refills | Status: DC

## 2023-02-16 NOTE — Progress Notes (Signed)
Endocrinology Follow Up Note       02/16/2023, 9:22 AM   Subjective:    Patient ID: Micheal Nelson, male    DOB: [birth year].  Micheal Nelson is being seen in follow up after being seen in consultation for management of currently uncontrolled symptomatic diabetes requested by  Benita Stabile, MD.   Past Medical History:  Diagnosis Date   Diabetes (HCC)    High cholesterol     Past Surgical History:  Procedure Laterality Date   HERNIA REPAIR      Social History   Socioeconomic History   Marital status: Married    Spouse name: Not on file   Number of children: 4   Years of education: Not on file   Highest education level: Not on file  Occupational History   Not on file  Tobacco Use   Smoking status: Never   Smokeless tobacco: Former    Types: Chew    Quit date: 10/18/2006  Vaping Use   Vaping Use: Never used  Substance and Sexual Activity   Alcohol use: Never   Drug use: Never   Sexual activity: Not on file  Other Topics Concern   Not on file  Social History Narrative   Not on file   Social Determinants of Health   Financial Resource Strain: Not on file  Food Insecurity: Not on file  Transportation Needs: Not on file  Physical Activity: Not on file  Stress: Not on file  Social Connections: Not on file    Family History  Problem Relation Age of Onset   Healthy Mother    Healthy Father     Outpatient Encounter Medications as of 02/16/2023  Medication Sig   glucose blood (FREESTYLE LITE) test strip Use as instructed to monitor glucose once daily.   Lancets (FREESTYLE) lancets Use as instructed to monitor glucose once daily   levocetirizine (XYZAL) 5 MG tablet Take 5 mg by mouth every evening.   lisinopril (ZESTRIL) 2.5 MG tablet Take 1 tablet (2.5 mg total) by mouth daily.   omeprazole (PRILOSEC) 20 MG capsule Take 1 capsule (20 mg total) by mouth 2 (two) times daily before a meal.    Semaglutide (RYBELSUS) 14 MG TABS Take 1 tablet (14 mg total) by mouth daily.   [DISCONTINUED] icosapent Ethyl (VASCEPA) 1 g capsule Take 2 capsules (2 g total) by mouth 2 (two) times daily for high triglyceride   [DISCONTINUED] metFORMIN (GLUCOPHAGE) 1000 MG tablet Take 1 tablet (1,000 mg total) by mouth 2 (two) times daily with a meal.   atorvastatin (LIPITOR) 40 MG tablet Take 0.5 tablets (20 mg total) by mouth daily.   Blood Glucose Monitoring Suppl (FREESTYLE LITE) w/Device KIT 1 kit by Does not apply route once for 1 dose.   cetirizine-pseudoephedrine (ZYRTEC-D) 5-120 MG tablet Take 1 tablet by mouth daily.   fluticasone (FLONASE) 50 MCG/ACT nasal spray Place 2 sprays into both nostrils daily.   metFORMIN (GLUCOPHAGE) 1000 MG tablet Take 0.5 tablets (500 mg total) by mouth 2 (two) times daily with a meal.   promethazine-dextromethorphan (PROMETHAZINE-DM) 6.25-15 MG/5ML syrup Take 5 mLs by mouth 4 (four) times daily as needed for cough. (Patient  not taking: Reported on 02/16/2023)   [DISCONTINUED] atorvastatin (LIPITOR) 40 MG tablet Take 1 tablet (40 mg total) by mouth daily.   [DISCONTINUED] glipiZIDE (GLUCOTROL XL) 5 MG 24 hr tablet Take 1 tablet (5 mg total) by mouth daily with breakfast. (Patient not taking: Reported on 02/16/2023)   No facility-administered encounter medications on file as of 02/16/2023.    ALLERGIES: No Known Allergies  VACCINATION STATUS: Immunization History  Administered Date(s) Administered   Moderna Sars-Covid-2 Vaccination 01/18/2020, 02/19/2020    Diabetes He presents for his follow-up diabetic visit. He has type 2 diabetes mellitus. Onset time: Was diagnosed at approx age of 34. His disease course has been improving. There are no hypoglycemic associated symptoms. Associated symptoms include fatigue and weight loss. Pertinent negatives for diabetes include no polyuria. There are no hypoglycemic complications. Symptoms are stable. There are no diabetic  complications. Risk factors for coronary artery disease include diabetes mellitus, dyslipidemia, family history, hypertension, male sex and obesity. Current diabetic treatment includes oral agent (dual therapy). He is compliant with treatment most of the time. His weight is decreasing steadily. He is following a generally healthy diet. When asked about meal planning, he reported none. He has not had a previous visit with a dietitian. He participates in exercise every other day. His home blood glucose trend is decreasing steadily. His breakfast blood glucose range is generally 90-110 mg/dl. (He presents today with his meter showing at target fasting glycemic profile.  His POCT A1c today is 5.5%, improving drastically from last A1c of 7.5%.  He notes he has changed his diet drastically.  He also stopped his Glipizide at the end of March due to hypoglycemia.  He denies any hypoglycemia now that he is off of it.  He has lost 30+ lbs and overall is feeling great!) An ACE inhibitor/angiotensin II receptor blocker is being taken. He does not see a podiatrist.Eye exam is current.  Hyperlipidemia This is a chronic problem. The current episode started more than 1 year ago. The problem is controlled. Recent lipid tests were reviewed and are normal. Exacerbating diseases include diabetes and obesity. Factors aggravating his hyperlipidemia include fatty foods. Current antihyperlipidemic treatment includes statins and herbal therapy. The current treatment provides moderate improvement of lipids. Compliance problems include adherence to diet and adherence to exercise.  Risk factors for coronary artery disease include diabetes mellitus, dyslipidemia, family history, hypertension, male sex and obesity.  Hypertension This is a chronic problem. The current episode started more than 1 year ago. The problem has been resolved since onset. The problem is controlled. There are no associated agents to hypertension. Risk factors for  coronary artery disease include diabetes mellitus, dyslipidemia, family history, male gender and obesity. Past treatments include ACE inhibitors. The current treatment provides significant improvement. There are no compliance problems.     Review of systems  Constitutional: + steadily decreasing body weight,  current Body mass index is 33.36 kg/m. , no fatigue, no subjective hyperthermia, no subjective hypothermia Eyes: no blurry vision, no xerophthalmia ENT: no sore throat, no nodules palpated in throat, no dysphagia/odynophagia, no hoarseness Cardiovascular: no chest pain, no shortness of breath, no palpitations, no leg swelling Respiratory: no cough, no shortness of breath Gastrointestinal: no nausea/vomiting/diarrhea Musculoskeletal: no muscle/joint aches Skin: no rashes, no hyperemia Neurological: no tremors, no numbness, no tingling, no dizziness Psychiatric: no depression, no anxiety  Objective:     BP 118/70 (BP Location: Right Arm, Patient Position: Sitting, Cuff Size: Normal)   Pulse 70  Ht 5\' 11"  (1.803 m)   Wt 239 lb 3.2 oz (108.5 kg)   BMI 33.36 kg/m   Wt Readings from Last 3 Encounters:  02/16/23 239 lb 3.2 oz (108.5 kg)  11/17/22 271 lb 3.2 oz (123 kg)  08/16/22 261 lb 3.2 oz (118.5 kg)     BP Readings from Last 3 Encounters:  02/16/23 118/70  11/17/22 120/76  08/16/22 118/70     Physical Exam- Limited  Constitutional:  Body mass index is 33.36 kg/m. , not in acute distress, normal state of mind Eyes:  EOMI, no exophthalmos Musculoskeletal: no gross deformities, strength intact in all four extremities, no gross restriction of joint movements Skin:  no rashes, no hyperemia Neurological: no tremor with outstretched hands  Diabetic Foot Exam - Simple   No data filed     CMP ( most recent) CMP     Component Value Date/Time   NA 140 02/11/2023 0820   K 4.2 02/11/2023 0820   CL 102 02/11/2023 0820   CO2 23 02/11/2023 0820   GLUCOSE 108 (H)  02/11/2023 0820   GLUCOSE 103 (H) 09/11/2007 1032   BUN 15 02/11/2023 0820   CREATININE 0.80 02/11/2023 0820   CALCIUM 9.4 02/11/2023 0820   PROT 6.9 02/11/2023 0820   ALBUMIN 4.6 02/11/2023 0820   AST 15 02/11/2023 0820   ALT 26 02/11/2023 0820   ALKPHOS 94 02/11/2023 0820   BILITOT 0.5 02/11/2023 0820   GFRNONAA 109 10/23/2020 0000   GFRAA  09/11/2007 1032    >60        The eGFR has been calculated using the MDRD equation. This calculation has not been validated in all clinical     Diabetic Labs (most recent): Lab Results  Component Value Date   HGBA1C 5.5 02/16/2023   HGBA1C 7.5 (A) 11/17/2022   HGBA1C 8.5 (A) 08/16/2022   MICROALBUR 10 mg/L 02/16/2023   MICROALBUR 10 02/11/2022   MICROALBUR 5 10/23/2020     Lipid Panel ( most recent) Lipid Panel     Component Value Date/Time   CHOL 101 02/11/2023 0820   TRIG 86 02/11/2023 0820   HDL 36 (L) 02/11/2023 0820   CHOLHDL 2.8 02/11/2023 0820   LDLCALC 48 02/11/2023 0820   LABVLDL 17 02/11/2023 0820      Lab Results  Component Value Date   TSH 1.310 02/11/2023   TSH 2.230 11/11/2021   FREET4 1.16 02/11/2023   FREET4 1.14 11/11/2021           Assessment & Plan:   1) Type 2 diabetes mellitus without complication, without long-term current use of insulin (HCC)  He presents today with his meter showing at target fasting glycemic profile.  His POCT A1c today is 5.5%, improving drastically from last A1c of 7.5%.  He notes he has changed his diet drastically.  He also stopped his Glipizide at the end of March due to hypoglycemia.  He denies any hypoglycemia now that he is off of it.  He has lost 30+ lbs and overall is feeling great!  - Micheal Nelson has currently uncontrolled symptomatic type 2 DM since 52 years of age.  -Recent labs reviewed.  - I had a long discussion with him about the progressive nature of diabetes and the pathology behind its complications. -his diabetes is not currently complicated  but he remains at a high risk for more acute and chronic complications which include CAD, CVA, CKD, retinopathy, and neuropathy. These are all discussed in detail  with him.  The following Lifestyle Medicine recommendations according to American College of Lifestyle Medicine [facility]) were discussed and offered to patient and he agrees to start the journey:  A. Whole Foods, Plant-based plate comprising of fruits and vegetables, plant-based proteins, whole-grain carbohydrates was discussed in detail with the patient.   A list for source of those nutrients were also provided to the patient.  Patient will use only water or unsweetened tea for hydration. B.  The need to stay away from risky substances including alcohol, smoking; obtaining 7 to 9 hours of restorative sleep, at least 150 minutes of moderate intensity exercise weekly, the importance of healthy social connections,  and stress reduction techniques were discussed. C.  A full color page of  Calorie density of various food groups per pound showing examples of each food groups was provided to the patient.  - Nutritional counseling repeated at each appointment due to patients tendency to fall back in to old habits.  - The patient admits there is a room for improvement in their diet and drink choices. -  Suggestion is made for the patient to avoid simple carbohydrates from their diet including Cakes, Sweet Desserts / Pastries, Ice Cream, Soda (diet and regular), Sweet Tea, Candies, Chips, Cookies, Sweet Pastries, Store Bought Juices, Alcohol in Excess of 1-2 drinks a day, Artificial Sweeteners, Coffee Creamer, and "Sugar-free" Products. This will help patient to have stable blood glucose profile and potentially avoid unintended weight gain.   - I encouraged the patient to switch to unprocessed or minimally processed complex starch and increased protein intake (animal or plant source), fruits, and vegetables.   - Patient is advised to stick to a routine  mealtimes to eat 3 meals a day and avoid unnecessary snacks (to snack only to correct hypoglycemia).  - I have approached him with the following individualized plan to manage his diabetes and patient agrees:   -Given his overall improvement, we can continue to de-escalate his treatment.  He is advised to continue Ryubelsus 14 mg po daily before breakfast, lower his Metformin to 500 mg po twice daily with meals (can take 0.5 of his current prescription til he runs out), and stay off the Glipizide altogether.   He is advised to continue monitoring blood glucose at least once daily 3 days a week, before breakfast and to call the clinic if he has readings less than 70 or greater than 200 for 3 tests in a row.   - Specific targets for  A1c; LDL, HDL, and Triglycerides were discussed with the patient.  2) Blood Pressure /Hypertension:  his blood pressure is controlled to target.   he is advised to continue his current medications including Lisinopril 2.5 mg p.o. daily with breakfast.  3) Lipids/Hyperlipidemia:    Review of his recent lipid panel from 02/11/23 showed controlled LDL at 48 and significantly improved triglycerides of 86-worsening .  he is advised to lower his Atorvastatin to 20 mg daily at bedtime.  Side effects and precautions discussed with him.   4)  Weight/Diet:  his Body mass index is 33.36 kg/m.  -  clearly complicating his diabetes care.   he is a candidate for weight loss. I discussed with him the fact that loss of 5 - 10% of his  current body weight will have the most impact on his diabetes management.  Exercise, and detailed carbohydrates information provided  -  detailed on discharge instructions.  He has had success with 30+ lbs weight loss thus far.  5) Vitamin D Deficiency: His most recent vitamin D level was 29.8 on 02/11/23.  He is advised to continue OTC vitamin D3 5000 units po daily.    6) Chronic Care/Health Maintenance: -he is on ACEI/ARB and Statin medications and  is encouraged to initiate and continue to follow up with Ophthalmology, Dentist, Podiatrist at least yearly or according to recommendations, and advised to stay away from smoking. I have recommended yearly flu vaccine and pneumonia vaccine at least every 5 years; moderate intensity exercise for up to 150 minutes weekly; and sleep for at least 7 hours a day.  - he is advised to maintain close follow up with Benita Stabile, MD for primary care needs, as well as his other providers for optimal and coordinated care.      I spent  41  minutes in the care of the patient today including review of labs from CMP, Lipids, Thyroid Function, Hematology (current and previous including abstractions from other facilities); face-to-face time discussing  his blood glucose readings/logs, discussing hypoglycemia and hyperglycemia episodes and symptoms, medications doses, his options of short and long term treatment based on the latest standards of care / guidelines;  discussion about incorporating lifestyle medicine;  and documenting the encounter. Risk reduction counseling performed per USPSTF guidelines to reduce obesity and cardiovascular risk factors.     Please refer to Patient Instructions for Blood Glucose Monitoring and Insulin/Medications Dosing Guide"  in media tab for additional information. Please  also refer to " Patient Self Inventory" in the Media  tab for reviewed elements of pertinent patient history.  Lamonte Sakai participated in the discussions, expressed understanding, and voiced agreement with the above plans.  All questions were answered to his satisfaction. he is encouraged to contact clinic should he have any questions or concerns prior to his return visit.     Follow up plan: - Return in about 4 months (around 06/19/2023) for Diabetes F/U with A1c in office, No previsit labs, Bring meter and logs.  Ronny Bacon, [facility] [address] [place], [place] [address] Phone: [phone] Fax: [phone]  02/16/2023, 9:22 AM

## 2023-02-17 ENCOUNTER — Other Ambulatory Visit: Payer: Self-pay | Admitting: Nurse Practitioner

## 2023-02-17 ENCOUNTER — Other Ambulatory Visit (HOSPITAL_COMMUNITY): Payer: Self-pay

## 2023-02-17 MED ORDER — METFORMIN HCL 500 MG PO TABS
500.0000 mg | ORAL_TABLET | Freq: Two times a day (BID) | ORAL | 0 refills | Status: DC
  Filled 2023-02-17: qty 180, 90d supply, fill #0

## 2023-03-10 ENCOUNTER — Other Ambulatory Visit (HOSPITAL_COMMUNITY): Payer: Self-pay

## 2023-03-10 ENCOUNTER — Other Ambulatory Visit: Payer: Self-pay

## 2023-03-10 ENCOUNTER — Telehealth: Payer: Self-pay | Admitting: Nurse Practitioner

## 2023-03-10 MED ORDER — ATORVASTATIN CALCIUM 40 MG PO TABS
20.0000 mg | ORAL_TABLET | Freq: Every day | ORAL | 3 refills | Status: DC
  Filled 2023-03-10: qty 45, 90d supply, fill #0
  Filled 2023-06-05: qty 45, 90d supply, fill #1
  Filled 2023-08-27: qty 45, 90d supply, fill #2
  Filled 2023-12-06: qty 45, 90d supply, fill #3
  Filled 2024-03-08: qty 45, 90d supply, fill #4

## 2023-03-10 NOTE — Telephone Encounter (Signed)
Pt said that [facility] did not receive his new RX for his Atorvastatin

## 2023-03-10 NOTE — Telephone Encounter (Signed)
Rx sent 

## 2023-03-31 DIAGNOSIS — E782 Mixed hyperlipidemia: Secondary | ICD-10-CM | POA: Insufficient documentation

## 2023-03-31 DIAGNOSIS — I10 Essential (primary) hypertension: Secondary | ICD-10-CM | POA: Insufficient documentation

## 2023-03-31 DIAGNOSIS — E669 Obesity, unspecified: Secondary | ICD-10-CM | POA: Insufficient documentation

## 2023-04-06 DIAGNOSIS — E782 Mixed hyperlipidemia: Secondary | ICD-10-CM | POA: Diagnosis not present

## 2023-05-14 ENCOUNTER — Other Ambulatory Visit: Payer: Self-pay | Admitting: Nurse Practitioner

## 2023-05-16 ENCOUNTER — Other Ambulatory Visit (HOSPITAL_COMMUNITY): Payer: Self-pay

## 2023-05-16 ENCOUNTER — Other Ambulatory Visit: Payer: Self-pay

## 2023-05-16 MED ORDER — METFORMIN HCL 500 MG PO TABS
500.0000 mg | ORAL_TABLET | Freq: Two times a day (BID) | ORAL | 0 refills | Status: DC
  Filled 2023-05-16: qty 180, 90d supply, fill #0

## 2023-06-21 ENCOUNTER — Ambulatory Visit: Payer: Commercial Managed Care - PPO | Admitting: Nurse Practitioner

## 2023-06-21 ENCOUNTER — Other Ambulatory Visit (HOSPITAL_COMMUNITY): Payer: Self-pay

## 2023-06-21 ENCOUNTER — Encounter: Payer: Self-pay | Admitting: Nurse Practitioner

## 2023-06-21 VITALS — BP 112/73 | HR 69 | Ht 71.0 in | Wt 249.0 lb

## 2023-06-21 DIAGNOSIS — E119 Type 2 diabetes mellitus without complications: Secondary | ICD-10-CM | POA: Diagnosis not present

## 2023-06-21 DIAGNOSIS — E559 Vitamin D deficiency, unspecified: Secondary | ICD-10-CM | POA: Diagnosis not present

## 2023-06-21 DIAGNOSIS — E782 Mixed hyperlipidemia: Secondary | ICD-10-CM

## 2023-06-21 DIAGNOSIS — Z7984 Long term (current) use of oral hypoglycemic drugs: Secondary | ICD-10-CM | POA: Diagnosis not present

## 2023-06-21 DIAGNOSIS — I10 Essential (primary) hypertension: Secondary | ICD-10-CM

## 2023-06-21 LAB — POCT GLYCOSYLATED HEMOGLOBIN (HGB A1C): Hemoglobin A1C: 6 % — AB (ref 4.0–5.6)

## 2023-06-21 MED ORDER — METFORMIN HCL 500 MG PO TABS
500.0000 mg | ORAL_TABLET | Freq: Two times a day (BID) | ORAL | 3 refills | Status: DC
  Filled 2023-06-21 – 2023-08-19 (×2): qty 180, 90d supply, fill #0
  Filled 2023-11-16: qty 180, 90d supply, fill #1
  Filled 2024-02-16: qty 180, 90d supply, fill #2

## 2023-06-21 MED ORDER — RYBELSUS 14 MG PO TABS
14.0000 mg | ORAL_TABLET | Freq: Every day | ORAL | 3 refills | Status: DC
  Filled 2023-06-21 – 2023-08-15 (×2): qty 90, 90d supply, fill #0
  Filled 2023-11-16: qty 90, 90d supply, fill #1
  Filled 2024-02-16: qty 90, 90d supply, fill #2

## 2023-06-21 NOTE — Progress Notes (Signed)
Endocrinology Follow Up Note       06/21/2023, 8:55 AM   Subjective:    Patient ID: Micheal Nelson, male    DOB: 22-Mar-1971.  Micheal Nelson is being seen in follow up after being seen in consultation for management of currently uncontrolled symptomatic diabetes requested by  Benita Stabile, MD.   Past Medical History:  Diagnosis Date   Diabetes (HCC)    High cholesterol     Past Surgical History:  Procedure Laterality Date   HERNIA REPAIR      Social History   Socioeconomic History   Marital status: Married    Spouse name: Not on file   Number of children: 4   Years of education: Not on file   Highest education level: Not on file  Occupational History   Not on file  Tobacco Use   Smoking status: Never   Smokeless tobacco: Former    Types: Chew    Quit date: 10/18/2006  Vaping Use   Vaping status: Never Used  Substance and Sexual Activity   Alcohol use: Never   Drug use: Never   Sexual activity: Not on file  Other Topics Concern   Not on file  Social History Narrative   Not on file   Social Determinants of Health   Financial Resource Strain: Not on file  Food Insecurity: Not on file  Transportation Needs: Not on file  Physical Activity: Not on file  Stress: Not on file  Social Connections: Not on file    Family History  Problem Relation Age of Onset   Healthy Mother    Healthy Father     Outpatient Encounter Medications as of 06/21/2023  Medication Sig   atorvastatin (LIPITOR) 40 MG tablet Take 1/2 tablet (20 mg total) by mouth daily.   Blood Glucose Monitoring Suppl (FREESTYLE LITE) w/Device KIT 1 kit by Does not apply route once for 1 dose.   cetirizine-pseudoephedrine (ZYRTEC-D) 5-120 MG tablet Take 1 tablet by mouth daily.   fluticasone (FLONASE) 50 MCG/ACT nasal spray Place 2 sprays into both nostrils daily.   glucose blood (FREESTYLE LITE) test strip Use as instructed to  monitor glucose once daily.   Lancets (FREESTYLE) lancets Use as instructed to monitor glucose once daily   levocetirizine (XYZAL) 5 MG tablet Take 5 mg by mouth every evening.   lisinopril (ZESTRIL) 2.5 MG tablet Take 1 tablet (2.5 mg total) by mouth daily.   omeprazole (PRILOSEC) 20 MG capsule Take 1 capsule (20 mg total) by mouth 2 (two) times daily before a meal.   [DISCONTINUED] metFORMIN (GLUCOPHAGE) 500 MG tablet Take 1 tablet (500 mg total) by mouth 2 (two) times daily with a meal.   [DISCONTINUED] Semaglutide (RYBELSUS) 14 MG TABS Take 1 tablet (14 mg total) by mouth daily.   metFORMIN (GLUCOPHAGE) 500 MG tablet Take 1 tablet (500 mg total) by mouth 2 (two) times daily with a meal.   promethazine-dextromethorphan (PROMETHAZINE-DM) 6.25-15 MG/5ML syrup Take 5 mLs by mouth 4 (four) times daily as needed for cough. (Patient not taking: Reported on 02/16/2023)   Semaglutide (RYBELSUS) 14 MG TABS Take 1 tablet (14 mg total) by mouth daily.  No facility-administered encounter medications on file as of 06/21/2023.    ALLERGIES: No Known Allergies  VACCINATION STATUS: Immunization History  Administered Date(s) Administered   Moderna Sars-Covid-2 Vaccination 01/18/2020, 02/19/2020    Diabetes He presents for his follow-up diabetic visit. He has type 2 diabetes mellitus. Onset time: Was diagnosed at approx age of 5. His disease course has been stable. There are no hypoglycemic associated symptoms. Pertinent negatives for diabetes include no fatigue, no polyuria and no weight loss. There are no hypoglycemic complications. Symptoms are stable. There are no diabetic complications. Risk factors for coronary artery disease include diabetes mellitus, dyslipidemia, family history, hypertension, male sex and obesity. Current diabetic treatment includes oral agent (dual therapy). He is compliant with treatment most of the time. His weight is increasing steadily. He is following a generally unhealthy  diet. When asked about meal planning, he reported none. He has not had a previous visit with a dietitian. He participates in exercise every other day. His home blood glucose trend is fluctuating minimally. His breakfast blood glucose range is generally 110-130 mg/dl. (He presents today with his meter mostly target fasting glycemic profile.  His POCT A1c today is 6%, increasing from last A1c of 5.5%. He admits he has over indulged in sweets over the summer but has plans to get back on track now.  Analysis of his meter shows 7-day average of 122, 30-day average of 128.  He has gained 10 lbs since last visit.) An ACE inhibitor/angiotensin II receptor blocker is being taken. He does not see a podiatrist.Eye exam is current.  Hyperlipidemia This is a chronic problem. The current episode started more than 1 year ago. The problem is controlled. Recent lipid tests were reviewed and are normal. Exacerbating diseases include diabetes and obesity. Factors aggravating his hyperlipidemia include fatty foods. Current antihyperlipidemic treatment includes statins and herbal therapy. The current treatment provides moderate improvement of lipids. Compliance problems include adherence to diet and adherence to exercise.  Risk factors for coronary artery disease include diabetes mellitus, dyslipidemia, family history, hypertension, male sex and obesity.  Hypertension This is a chronic problem. The current episode started more than 1 year ago. The problem has been resolved since onset. The problem is controlled. There are no associated agents to hypertension. Risk factors for coronary artery disease include diabetes mellitus, dyslipidemia, family history, male gender and obesity. Past treatments include ACE inhibitors. The current treatment provides significant improvement. There are no compliance problems.     Review of systems  Constitutional: + steadily increasing body weight,  current Body mass index is 34.73 kg/m. , no  fatigue, no subjective hyperthermia, no subjective hypothermia Eyes: no blurry vision, no xerophthalmia ENT: no sore throat, no nodules palpated in throat, no dysphagia/odynophagia, no hoarseness Cardiovascular: no chest pain, no shortness of breath, no palpitations, no leg swelling Respiratory: no cough, no shortness of breath Gastrointestinal: no nausea/vomiting/diarrhea Musculoskeletal: no muscle/joint aches Skin: no rashes, no hyperemia Neurological: no tremors, no numbness, no tingling, no dizziness Psychiatric: no depression, no anxiety  Objective:     BP 112/73 (BP Location: Left Arm, Patient Position: Sitting, Cuff Size: Large)   Pulse 69   Ht 5\' 11"  (1.803 m)   Wt 249 lb (112.9 kg)   BMI 34.73 kg/m   Wt Readings from Last 3 Encounters:  06/21/23 249 lb (112.9 kg)  02/16/23 239 lb 3.2 oz (108.5 kg)  11/17/22 271 lb 3.2 oz (123 kg)     BP Readings from Last 3 Encounters:  06/21/23 112/73  02/16/23 118/70  11/17/22 120/76      Physical Exam- Limited  Constitutional:  Body mass index is 34.73 kg/m. , not in acute distress, normal state of mind Eyes:  EOMI, no exophthalmos Musculoskeletal: no gross deformities, strength intact in all four extremities, no gross restriction of joint movements Skin:  no rashes, no hyperemia Neurological: no tremor with outstretched hands  Diabetic Foot Exam - Simple   No data filed     CMP ( most recent) CMP     Component Value Date/Time   NA 140 02/11/2023 0820   K 4.2 02/11/2023 0820   CL 102 02/11/2023 0820   CO2 23 02/11/2023 0820   GLUCOSE 108 (H) 02/11/2023 0820   GLUCOSE 103 (H) 09/11/2007 1032   BUN 15 02/11/2023 0820   CREATININE 0.80 02/11/2023 0820   CALCIUM 9.4 02/11/2023 0820   PROT 6.9 02/11/2023 0820   ALBUMIN 4.6 02/11/2023 0820   AST 15 02/11/2023 0820   ALT 26 02/11/2023 0820   ALKPHOS 94 02/11/2023 0820   BILITOT 0.5 02/11/2023 0820   GFRNONAA 109 10/23/2020 0000   GFRAA  09/11/2007 1032    >60         The eGFR has been calculated using the MDRD equation. This calculation has not been validated in all clinical     Diabetic Labs (most recent): Lab Results  Component Value Date   HGBA1C 6.0 (A) 06/21/2023   HGBA1C 5.5 02/16/2023   HGBA1C 7.5 (A) 11/17/2022   MICROALBUR 10 mg/L 02/16/2023   MICROALBUR 10 02/11/2022   MICROALBUR 5 10/23/2020     Lipid Panel ( most recent) Lipid Panel     Component Value Date/Time   CHOL 101 02/11/2023 0820   TRIG 86 02/11/2023 0820   HDL 36 (L) 02/11/2023 0820   CHOLHDL 2.8 02/11/2023 0820   LDLCALC 48 02/11/2023 0820   LABVLDL 17 02/11/2023 0820      Lab Results  Component Value Date   TSH 1.310 02/11/2023   TSH 2.230 11/11/2021   FREET4 1.16 02/11/2023   FREET4 1.14 11/11/2021           Assessment & Plan:   1) Type 2 diabetes mellitus without complication, without long-term current use of insulin (HCC)  He presents today with his meter mostly target fasting glycemic profile.  His POCT A1c today is 6%, increasing from last A1c of 5.5%. He admits he has over indulged in sweets over the summer but has plans to get back on track now.  Analysis of his meter shows 7-day average of 122, 30-day average of 128.  He has gained 10 lbs since last visit.  - Micheal Nelson has currently uncontrolled symptomatic type 2 DM since 52 years of age.  -Recent labs reviewed.  - I had a long discussion with him about the progressive nature of diabetes and the pathology behind its complications. -his diabetes is not currently complicated but he remains at a high risk for more acute and chronic complications which include CAD, CVA, CKD, retinopathy, and neuropathy. These are all discussed in detail with him.  The following Lifestyle Medicine recommendations according to American College of Lifestyle Medicine [facility]) were discussed and offered to patient and he agrees to start the journey:  A. Whole Foods, Plant-based plate comprising of  fruits and vegetables, plant-based proteins, whole-grain carbohydrates was discussed in detail with the patient.   A list for source of those nutrients were also provided to the patient.  Patient will use only water or unsweetened tea for hydration. B.  The need to stay away from risky substances including alcohol, smoking; obtaining 7 to 9 hours of restorative sleep, at least 150 minutes of moderate intensity exercise weekly, the importance of healthy social connections,  and stress reduction techniques were discussed. C.  A full color page of  Calorie density of various food groups per pound showing examples of each food groups was provided to the patient.  - Nutritional counseling repeated at each appointment due to patients tendency to fall back in to old habits.  - The patient admits there is a room for improvement in their diet and drink choices. -  Suggestion is made for the patient to avoid simple carbohydrates from their diet including Cakes, Sweet Desserts / Pastries, Ice Cream, Soda (diet and regular), Sweet Tea, Candies, Chips, Cookies, Sweet Pastries, Store Bought Juices, Alcohol in Excess of 1-2 drinks a day, Artificial Sweeteners, Coffee Creamer, and "Sugar-free" Products. This will help patient to have stable blood glucose profile and potentially avoid unintended weight gain.   - I encouraged the patient to switch to unprocessed or minimally processed complex starch and increased protein intake (animal or plant source), fruits, and vegetables.   - Patient is advised to stick to a routine mealtimes to eat 3 meals a day and avoid unnecessary snacks (to snack only to correct hypoglycemia).  - I have approached him with the following individualized plan to manage his diabetes and patient agrees:   -He is advised to continue Rybelsus 14 mg po daily before breakfast, and Metformin 500 mg po twice daily with meals.   He is advised to continue monitoring blood glucose at least once daily 2-3  days a week, before breakfast and to call the clinic if he has readings less than 70 or greater than 200 for 3 tests in a row.   - Specific targets for  A1c; LDL, HDL, and Triglycerides were discussed with the patient.  2) Blood Pressure /Hypertension:  his blood pressure is controlled to target.   he is advised to continue his current medications including Lisinopril 2.5 mg p.o. daily with breakfast.  3) Lipids/Hyperlipidemia:    Review of his recent lipid panel from 04/06/23 showed controlled LDL at 50 and triglycerides of 147 .  he is advised to continue his Atorvastatin 20 mg daily at bedtime.  Side effects and precautions discussed with him.   4)  Weight/Diet:  his Body mass index is 34.73 kg/m.  -  clearly complicating his diabetes care.   he is a candidate for weight loss. I discussed with him the fact that loss of 5 - 10% of his  current body weight will have the most impact on his diabetes management.  Exercise, and detailed carbohydrates information provided  -  detailed on discharge instructions.  He has had success with 30+ lbs weight loss thus far.  5) Vitamin D Deficiency: His most recent vitamin D level was 29.8 on 02/11/23.  He is advised to continue OTC vitamin D3 5000 units po daily.    6) Chronic Care/Health Maintenance: -he is on ACEI/ARB and Statin medications and is encouraged to initiate and continue to follow up with Ophthalmology, Dentist, Podiatrist at least yearly or according to recommendations, and advised to stay away from smoking. I have recommended yearly flu vaccine and pneumonia vaccine at least every 5 years; moderate intensity exercise for up to 150 minutes weekly; and sleep for at least 7 hours a  day.  - he is advised to maintain close follow up with Benita Stabile, MD for primary care needs, as well as his other providers for optimal and coordinated care.      I spent  30  minutes in the care of the patient today including review of labs from CMP, Lipids,  Thyroid Function, Hematology (current and previous including abstractions from other facilities); face-to-face time discussing  his blood glucose readings/logs, discussing hypoglycemia and hyperglycemia episodes and symptoms, medications doses, his options of short and long term treatment based on the latest standards of care / guidelines;  discussion about incorporating lifestyle medicine;  and documenting the encounter. Risk reduction counseling performed per USPSTF guidelines to reduce obesity and cardiovascular risk factors.     Please refer to Patient Instructions for Blood Glucose Monitoring and Insulin/Medications Dosing Guide"  in media tab for additional information. Please  also refer to " Patient Self Inventory" in the Media  tab for reviewed elements of pertinent patient history.  Micheal Nelson participated in the discussions, expressed understanding, and voiced agreement with the above plans.  All questions were answered to his satisfaction. he is encouraged to contact clinic should he have any questions or concerns prior to his return visit.     Follow up plan: - Return in about 4 months (around 10/21/2023) for Diabetes F/U with A1c in office, No previsit labs, Bring meter and logs.  Ronny Bacon, [facility] [address] [place], [place] [address] Phone: [phone] Fax: [phone]  06/21/2023, 8:55 AM

## 2023-07-01 IMAGING — DX DG CHEST 2V
2 series · 2 of 2 positions shown · non-contrast
Comparison: None.

CLINICAL DATA: Cough and wheezing.

EXAM:
CHEST - 2 VIEW

[chest pa]
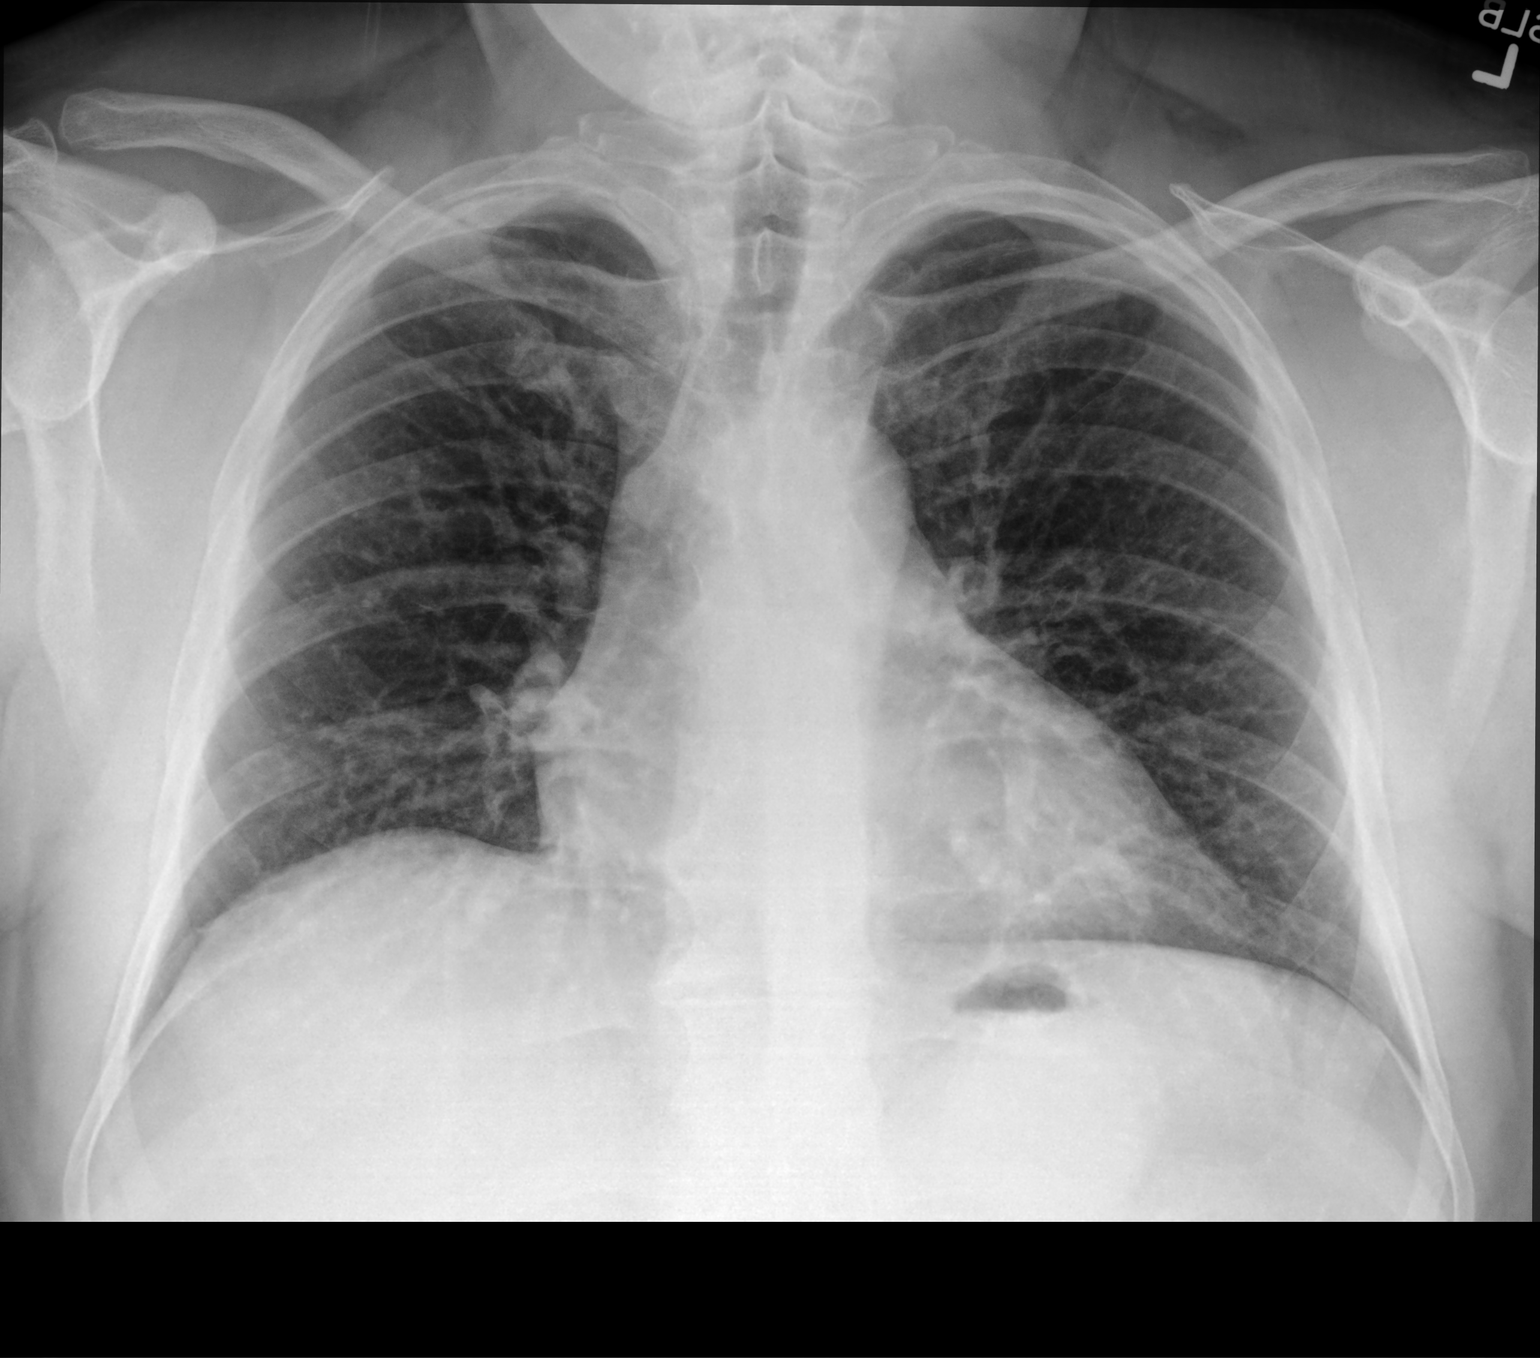

[chest lat]
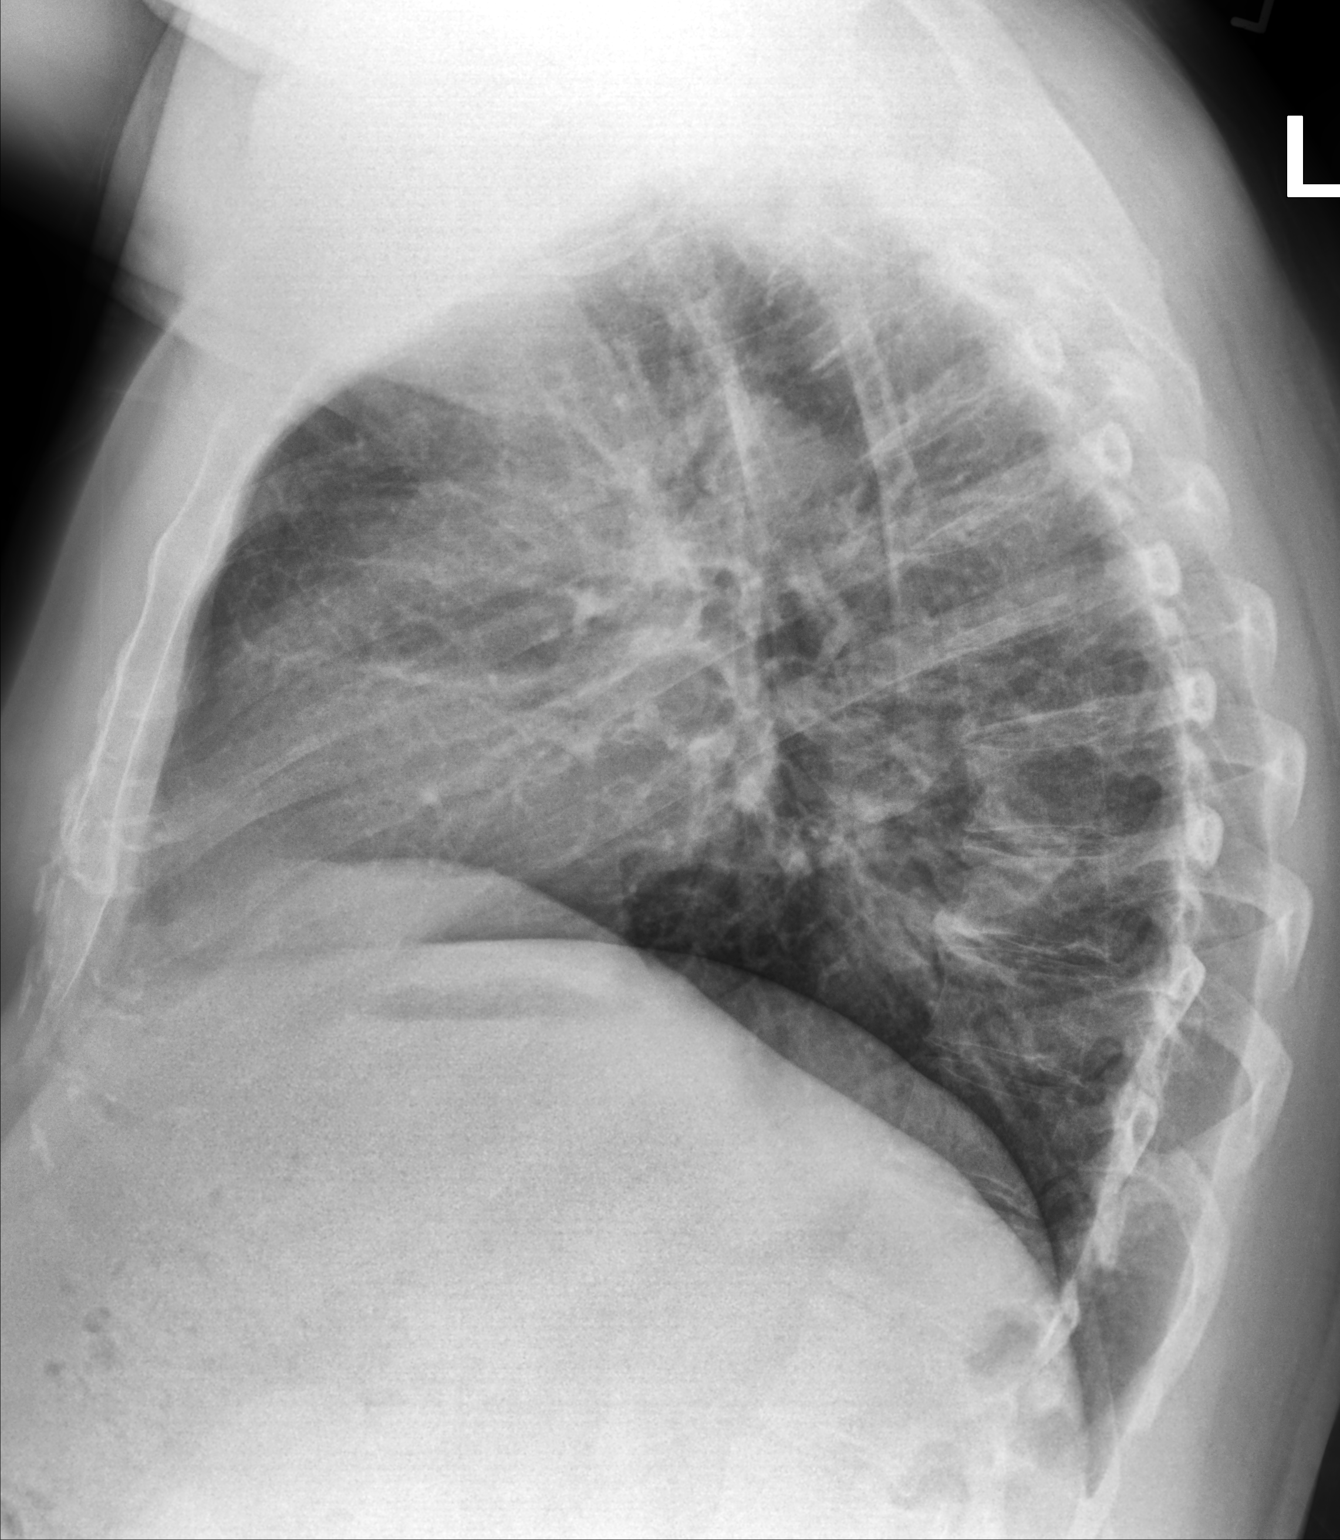

[2 of 2 positions shown; findings below may reference images not displayed]

FINDINGS: The cardiac silhouette, mediastinal and hilar contours are within
normal limits. There is mild peribronchial thickening and slight
increased interstitial markings suggesting bronchitis. No
infiltrates or effusions. The bony thorax is intact.
IMPRESSION: Findings suggest bronchitis. No infiltrates or effusions.

## 2023-07-06 ENCOUNTER — Other Ambulatory Visit (HOSPITAL_COMMUNITY): Payer: Self-pay

## 2023-07-06 ENCOUNTER — Other Ambulatory Visit: Payer: Self-pay | Admitting: Nurse Practitioner

## 2023-07-06 DIAGNOSIS — E119 Type 2 diabetes mellitus without complications: Secondary | ICD-10-CM

## 2023-07-06 MED ORDER — FREESTYLE LITE TEST VI STRP
ORAL_STRIP | Freq: Every day | 1 refills | Status: DC
Start: 2023-07-06 — End: 2023-10-28
  Filled 2023-07-06: qty 50, 50d supply, fill #0

## 2023-08-10 LAB — HM DIABETES EYE EXAM

## 2023-08-15 ENCOUNTER — Other Ambulatory Visit: Payer: Self-pay | Admitting: Nurse Practitioner

## 2023-08-15 ENCOUNTER — Other Ambulatory Visit (HOSPITAL_COMMUNITY): Payer: Self-pay

## 2023-08-16 ENCOUNTER — Other Ambulatory Visit: Payer: Self-pay

## 2023-08-16 ENCOUNTER — Other Ambulatory Visit (HOSPITAL_COMMUNITY): Payer: Self-pay

## 2023-08-16 MED ORDER — LISINOPRIL 2.5 MG PO TABS
2.5000 mg | ORAL_TABLET | Freq: Every day | ORAL | 0 refills | Status: DC
  Filled 2023-08-16: qty 90, 90d supply, fill #0

## 2023-08-19 ENCOUNTER — Other Ambulatory Visit (HOSPITAL_COMMUNITY): Payer: Self-pay

## 2023-08-22 ENCOUNTER — Encounter: Payer: Self-pay | Admitting: Nurse Practitioner

## 2023-10-17 DIAGNOSIS — M25561 Pain in right knee: Secondary | ICD-10-CM | POA: Diagnosis not present

## 2023-10-25 ENCOUNTER — Ambulatory Visit: Payer: Commercial Managed Care - PPO | Admitting: Nurse Practitioner

## 2023-10-25 DIAGNOSIS — E119 Type 2 diabetes mellitus without complications: Secondary | ICD-10-CM

## 2023-10-25 DIAGNOSIS — I10 Essential (primary) hypertension: Secondary | ICD-10-CM

## 2023-10-25 DIAGNOSIS — E559 Vitamin D deficiency, unspecified: Secondary | ICD-10-CM

## 2023-10-25 DIAGNOSIS — Z7984 Long term (current) use of oral hypoglycemic drugs: Secondary | ICD-10-CM

## 2023-10-25 DIAGNOSIS — E782 Mixed hyperlipidemia: Secondary | ICD-10-CM

## 2023-10-28 ENCOUNTER — Ambulatory Visit: Payer: Commercial Managed Care - PPO | Admitting: Nurse Practitioner

## 2023-10-28 ENCOUNTER — Other Ambulatory Visit: Payer: Self-pay | Admitting: *Deleted

## 2023-10-28 ENCOUNTER — Other Ambulatory Visit (HOSPITAL_COMMUNITY): Payer: Self-pay

## 2023-10-28 ENCOUNTER — Encounter: Payer: Self-pay | Admitting: Nurse Practitioner

## 2023-10-28 VITALS — BP 108/70 | HR 74 | Ht 71.0 in | Wt 262.0 lb

## 2023-10-28 DIAGNOSIS — E782 Mixed hyperlipidemia: Secondary | ICD-10-CM | POA: Diagnosis not present

## 2023-10-28 DIAGNOSIS — Z7984 Long term (current) use of oral hypoglycemic drugs: Secondary | ICD-10-CM | POA: Diagnosis not present

## 2023-10-28 DIAGNOSIS — I10 Essential (primary) hypertension: Secondary | ICD-10-CM

## 2023-10-28 DIAGNOSIS — E119 Type 2 diabetes mellitus without complications: Secondary | ICD-10-CM | POA: Diagnosis not present

## 2023-10-28 DIAGNOSIS — E559 Vitamin D deficiency, unspecified: Secondary | ICD-10-CM

## 2023-10-28 LAB — HEMOGLOBIN A1C: Hemoglobin A1C: 7

## 2023-10-28 MED ORDER — FREESTYLE LITE TEST VI STRP
ORAL_STRIP | Freq: Every day | 1 refills | Status: AC
  Filled 2023-10-28: qty 100, fill #0
  Filled 2023-10-28: qty 50, 50d supply, fill #0

## 2023-10-28 MED ORDER — ACCU-CHEK GUIDE ME W/DEVICE KIT
PACK | Freq: Every day | 0 refills | Status: AC
  Filled 2023-10-28: qty 1, 30d supply, fill #0

## 2023-10-28 NOTE — Progress Notes (Signed)
 Endocrinology Follow Up Note       10/28/2023, 8:55 AM   Subjective:    Patient ID: Micheal Nelson, male    DOB: October 05, 1971.  Micheal Nelson is being seen in follow up after being seen in consultation for management of currently uncontrolled symptomatic diabetes requested by  Shona Norleen PEDLAR, MD.   Past Medical History:  Diagnosis Date   Diabetes (HCC)    High cholesterol     Past Surgical History:  Procedure Laterality Date   HERNIA REPAIR      Social History   Socioeconomic History   Marital status: Married    Spouse name: Not on file   Number of children: 4   Years of education: Not on file   Highest education level: Not on file  Occupational History   Not on file  Tobacco Use   Smoking status: Never   Smokeless tobacco: Former    Types: Chew    Quit date: 10/18/2006  Vaping Use   Vaping status: Never Used  Substance and Sexual Activity   Alcohol use: Never   Drug use: Never   Sexual activity: Not on file  Other Topics Concern   Not on file  Social History Narrative   Not on file   Social Drivers of Health   Financial Resource Strain: Not on file  Food Insecurity: Not on file  Transportation Needs: Not on file  Physical Activity: Not on file  Stress: Not on file  Social Connections: Not on file    Family History  Problem Relation Age of Onset   Healthy Mother    Healthy Father     Outpatient Encounter Medications as of 10/28/2023  Medication Sig   atorvastatin  (LIPITOR) 40 MG tablet Take 1/2 tablet (20 mg total) by mouth daily.   Blood Glucose Monitoring Suppl (ACCU-CHEK GUIDE ME) w/Device KIT Use to monitor glucose once daily   cetirizine -pseudoephedrine  (ZYRTEC -D) 5-120 MG tablet Take 1 tablet by mouth daily.   fluticasone  (FLONASE ) 50 MCG/ACT nasal spray Place 2 sprays into both nostrils daily.   glucose blood (FREESTYLE LITE) test strip Use as instructed to monitor  glucose once daily.   Lancets (FREESTYLE) lancets Use as instructed to monitor glucose once daily   levocetirizine (XYZAL) 5 MG tablet Take 5 mg by mouth every evening.   lisinopril  (ZESTRIL ) 2.5 MG tablet Take 1 tablet (2.5 mg total) by mouth daily.   metFORMIN  (GLUCOPHAGE ) 500 MG tablet Take 1 tablet (500 mg total) by mouth 2 (two) times daily with a meal.   omeprazole  (PRILOSEC) 20 MG capsule Take 1 capsule (20 mg total) by mouth 2 (two) times daily before a meal.   promethazine -dextromethorphan (PROMETHAZINE -DM) 6.25-15 MG/5ML syrup Take 5 mLs by mouth 4 (four) times daily as needed for cough.   Semaglutide  (RYBELSUS ) 14 MG TABS Take 1 tablet (14 mg total) by mouth daily.   [DISCONTINUED] Blood Glucose Monitoring Suppl (FREESTYLE LITE) w/Device KIT 1 kit by Does not apply route once for 1 dose.   No facility-administered encounter medications on file as of 10/28/2023.    ALLERGIES: No Known Allergies  VACCINATION STATUS: Immunization History  Administered Date(s) Administered   Moderna  Sars-Covid-2 Vaccination 01/18/2020, 02/19/2020    Diabetes He presents for his follow-up diabetic visit. He has type 2 diabetes mellitus. Onset time: Was diagnosed at approx age of 26. His disease course has been stable. There are no hypoglycemic associated symptoms. Pertinent negatives for diabetes include no fatigue, no polyuria and no weight loss. There are no hypoglycemic complications. Symptoms are stable. There are no diabetic complications. Risk factors for coronary artery disease include diabetes mellitus, dyslipidemia, family history, hypertension, male sex and obesity. Current diabetic treatment includes oral agent (dual therapy). He is compliant with treatment most of the time. His weight is increasing steadily. He is following a generally unhealthy diet. When asked about meal planning, he reported none. He has not had a previous visit with a dietitian. He participates in exercise every other day.  His home blood glucose trend is fluctuating minimally. (He presents today with his meter (which he has been having trouble with), unable to access readings on the device.  He notes his recent morning glucose readings have been a bit higher but he recently had steroid injection in his knee for pain.  His POCT A1c today is 7% increasing from last visit of 6%.  He admits he overindulged in foods during the holidays but promises to do better with his diet moving forward.) An ACE inhibitor/angiotensin II receptor blocker is being taken. He does not see a podiatrist.Eye exam is current.  Hyperlipidemia This is a chronic problem. The current episode started more than 1 year ago. The problem is controlled. Recent lipid tests were reviewed and are normal. Exacerbating diseases include diabetes and obesity. Factors aggravating his hyperlipidemia include fatty foods. Current antihyperlipidemic treatment includes statins and herbal therapy. The current treatment provides moderate improvement of lipids. Compliance problems include adherence to diet and adherence to exercise.  Risk factors for coronary artery disease include diabetes mellitus, dyslipidemia, family history, hypertension, male sex and obesity.  Hypertension This is a chronic problem. The current episode started more than 1 year ago. The problem has been resolved since onset. The problem is controlled. There are no associated agents to hypertension. Risk factors for coronary artery disease include diabetes mellitus, dyslipidemia, family history, male gender and obesity. Past treatments include ACE inhibitors. The current treatment provides significant improvement. There are no compliance problems.     Review of systems  Constitutional: + steadily increasing body weight,  current Body mass index is 36.54 kg/m. , no fatigue, no subjective hyperthermia, no subjective hypothermia Eyes: no blurry vision, no xerophthalmia ENT: no sore throat, no nodules  palpated in throat, no dysphagia/odynophagia, no hoarseness Cardiovascular: no chest pain, no shortness of breath, no palpitations, no leg swelling Respiratory: no cough, no shortness of breath Gastrointestinal: no nausea/vomiting/diarrhea Musculoskeletal: no muscle/joint aches Skin: no rashes, no hyperemia Neurological: no tremors, no numbness, no tingling, no dizziness Psychiatric: no depression, no anxiety  Objective:     BP 108/70 (BP Location: Left Arm, Patient Position: Sitting, Cuff Size: Large)   Pulse 74   Ht 5' 11 (1.803 m)   Wt 262 lb (118.8 kg)   BMI 36.54 kg/m   Wt Readings from Last 3 Encounters:  10/28/23 262 lb (118.8 kg)  06/21/23 249 lb (112.9 kg)  02/16/23 239 lb 3.2 oz (108.5 kg)     BP Readings from Last 3 Encounters:  10/28/23 108/70  06/21/23 112/73  02/16/23 118/70      Physical Exam- Limited  Constitutional:  Body mass index is 36.54 kg/m. , not in acute  distress, normal state of mind Eyes:  EOMI, no exophthalmos Musculoskeletal: no gross deformities, strength intact in all four extremities, no gross restriction of joint movements Skin:  no rashes, no hyperemia Neurological: no tremor with outstretched hands  Diabetic Foot Exam - Simple   No data filed     CMP ( most recent) CMP     Component Value Date/Time   NA 140 02/11/2023 0820   K 4.2 02/11/2023 0820   CL 102 02/11/2023 0820   CO2 23 02/11/2023 0820   GLUCOSE 108 (H) 02/11/2023 0820   GLUCOSE 103 (H) 09/11/2007 1032   BUN 15 02/11/2023 0820   CREATININE 0.80 02/11/2023 0820   CALCIUM  9.4 02/11/2023 0820   PROT 6.9 02/11/2023 0820   ALBUMIN 4.6 02/11/2023 0820   AST 15 02/11/2023 0820   ALT 26 02/11/2023 0820   ALKPHOS 94 02/11/2023 0820   BILITOT 0.5 02/11/2023 0820   GFRNONAA 109 10/23/2020 0000   GFRAA  09/11/2007 1032    >60        The eGFR has been calculated using the MDRD equation. This calculation has not been validated in all clinical     Diabetic Labs  (most recent): Lab Results  Component Value Date   HGBA1C 6.0 (A) 06/21/2023   HGBA1C 5.5 02/16/2023   HGBA1C 7.5 (A) 11/17/2022   MICROALBUR 10 mg/L 02/16/2023   MICROALBUR 10 02/11/2022   MICROALBUR 5 10/23/2020     Lipid Panel ( most recent) Lipid Panel     Component Value Date/Time   CHOL 101 02/11/2023 0820   TRIG 86 02/11/2023 0820   HDL 36 (L) 02/11/2023 0820   CHOLHDL 2.8 02/11/2023 0820   LDLCALC 48 02/11/2023 0820   LABVLDL 17 02/11/2023 0820      Lab Results  Component Value Date   TSH 1.310 02/11/2023   TSH 2.230 11/11/2021   FREET4 1.16 02/11/2023   FREET4 1.14 11/11/2021           Assessment & Plan:   1) Type 2 diabetes mellitus without complication, without long-term current use of insulin (HCC)  He presents today with his meter (which he has been having trouble with), unable to access readings on the device.  He notes his recent morning glucose readings have been a bit higher but he recently had steroid injection in his knee for pain.  His POCT A1c today is 7% increasing from last visit of 6%.  He admits he overindulged in foods during the holidays but promises to do better with his diet moving forward.  - DHAIRYA CORALES has currently uncontrolled symptomatic type 2 DM since 53 years of age.  -Recent labs reviewed.  - I had a long discussion with him about the progressive nature of diabetes and the pathology behind its complications. -his diabetes is not currently complicated but he remains at a high risk for more acute and chronic complications which include CAD, CVA, CKD, retinopathy, and neuropathy. These are all discussed in detail with him.  The following Lifestyle Medicine recommendations according to American College of Lifestyle Medicine [facility]) were discussed and offered to patient and he agrees to start the journey:  A. Whole Foods, Plant-based plate comprising of fruits and vegetables, plant-based proteins, whole-grain carbohydrates was  discussed in detail with the patient.   A list for source of those nutrients were also provided to the patient.  Patient will use only water or unsweetened tea for hydration. B.  The need to stay away from  risky substances including alcohol, smoking; obtaining 7 to 9 hours of restorative sleep, at least 150 minutes of moderate intensity exercise weekly, the importance of healthy social connections,  and stress reduction techniques were discussed. C.  A full color page of  Calorie density of various food groups per pound showing examples of each food groups was provided to the patient.  - Nutritional counseling repeated at each appointment due to patients tendency to fall back in to old habits.  - The patient admits there is a room for improvement in their diet and drink choices. -  Suggestion is made for the patient to avoid simple carbohydrates from their diet including Cakes, Sweet Desserts / Pastries, Ice Cream, Soda (diet and regular), Sweet Tea, Candies, Chips, Cookies, Sweet Pastries, Store Bought Juices, Alcohol in Excess of 1-2 drinks a day, Artificial Sweeteners, Coffee Creamer, and Sugar-free Products. This will help patient to have stable blood glucose profile and potentially avoid unintended weight gain.   - I encouraged the patient to switch to unprocessed or minimally processed complex starch and increased protein intake (animal or plant source), fruits, and vegetables.   - Patient is advised to stick to a routine mealtimes to eat 3 meals a day and avoid unnecessary snacks (to snack only to correct hypoglycemia).  - I have approached him with the following individualized plan to manage his diabetes and patient agrees:   -He is advised to continue Rybelsus  14 mg po daily before breakfast, and Metformin  500 mg po twice daily with meals.   He is advised to continue monitoring blood glucose at least once daily 2-3 days a week, before breakfast and to call the clinic if he has readings  less than 70 or greater than 200 for 3 tests in a row.   I did send in new meter for him and also gave him information on Stelo.  - Specific targets for  A1c; LDL, HDL, and Triglycerides were discussed with the patient.  2) Blood Pressure /Hypertension:  his blood pressure is controlled to target.   he is advised to continue his current medications including Lisinopril  2.5 mg p.o. daily with breakfast.  3) Lipids/Hyperlipidemia:    Review of his recent lipid panel from 04/06/23 showed controlled LDL at 50 and triglycerides of 147 .  he is advised to continue his Atorvastatin  20 mg daily at bedtime.  Side effects and precautions discussed with him.   4)  Weight/Diet:  his Body mass index is 36.54 kg/m.  -  clearly complicating his diabetes care.   he is a candidate for weight loss. I discussed with him the fact that loss of 5 - 10% of his  current body weight will have the most impact on his diabetes management.  Exercise, and detailed carbohydrates information provided  -  detailed on discharge instructions.  He has had success with 30+ lbs weight loss thus far.  5) Vitamin D  Deficiency: His most recent vitamin D  level was 29.8 on 02/11/23.  He is advised to continue OTC vitamin D3 5000 units po daily.    6) Chronic Care/Health Maintenance: -he is on ACEI/ARB and Statin medications and is encouraged to initiate and continue to follow up with Ophthalmology, Dentist, Podiatrist at least yearly or according to recommendations, and advised to stay away from smoking. I have recommended yearly flu vaccine and pneumonia vaccine at least every 5 years; moderate intensity exercise for up to 150 minutes weekly; and sleep for at least 7 hours a day.  -  he is advised to maintain close follow up with Shona Norleen PEDLAR, MD for primary care needs, as well as his other providers for optimal and coordinated care.      I spent  31  minutes in the care of the patient today including review of labs from CMP, Lipids,  Thyroid  Function, Hematology (current and previous including abstractions from other facilities); face-to-face time discussing  his blood glucose readings/logs, discussing hypoglycemia and hyperglycemia episodes and symptoms, medications doses, his options of short and long term treatment based on the latest standards of care / guidelines;  discussion about incorporating lifestyle medicine;  and documenting the encounter. Risk reduction counseling performed per USPSTF guidelines to reduce obesity and cardiovascular risk factors.     Please refer to Patient Instructions for Blood Glucose Monitoring and Insulin/Medications Dosing Guide  in media tab for additional information. Please  also refer to  Patient Self Inventory in the Media  tab for reviewed elements of pertinent patient history.  Nelwyn KANDICE Anon participated in the discussions, expressed understanding, and voiced agreement with the above plans.  All questions were answered to his satisfaction. he is encouraged to contact clinic should he have any questions or concerns prior to his return visit.     Follow up plan: - Return in about 4 months (around 02/25/2024) for Diabetes F/U with A1c in office, No previsit labs.  Benton Rio, [facility] [address] [place], [place] [address] Phone: [phone] Fax: [phone]  10/28/2023, 8:55 AM

## 2023-10-28 NOTE — Addendum Note (Signed)
 Addended by: Dani Gobble on: 10/28/2023 09:03 AM   Modules accepted: Orders

## 2023-11-16 ENCOUNTER — Other Ambulatory Visit: Payer: Self-pay | Admitting: Nurse Practitioner

## 2023-11-17 ENCOUNTER — Other Ambulatory Visit (HOSPITAL_COMMUNITY): Payer: Self-pay

## 2023-11-17 ENCOUNTER — Other Ambulatory Visit: Payer: Self-pay

## 2023-11-17 MED ORDER — LISINOPRIL 2.5 MG PO TABS
2.5000 mg | ORAL_TABLET | Freq: Every day | ORAL | 0 refills | Status: AC
  Filled 2023-11-17: qty 90, 90d supply, fill #0

## 2024-03-02 ENCOUNTER — Ambulatory Visit: Payer: Commercial Managed Care - PPO | Admitting: Nurse Practitioner

## 2024-04-12 ENCOUNTER — Ambulatory Visit: Payer: Self-pay

## 2024-04-12 VITALS — BP 115/78 | HR 83 | Ht 71.0 in | Wt 264.0 lb

## 2024-04-12 DIAGNOSIS — Z1159 Encounter for screening for other viral diseases: Secondary | ICD-10-CM

## 2024-04-12 DIAGNOSIS — Z0000 Encounter for general adult medical examination without abnormal findings: Secondary | ICD-10-CM | POA: Diagnosis not present

## 2024-04-12 DIAGNOSIS — Z1211 Encounter for screening for malignant neoplasm of colon: Secondary | ICD-10-CM | POA: Diagnosis not present

## 2024-04-12 DIAGNOSIS — Z125 Encounter for screening for malignant neoplasm of prostate: Secondary | ICD-10-CM | POA: Diagnosis not present

## 2024-04-12 NOTE — Progress Notes (Signed)
 New Patient Office Visit  Subjective    Patient ID: Micheal Nelson, male    DOB: 03-14-1971  Age: 53 y.o. MRN: [id number]  CC:  Chief Complaint  Patient presents with   Establish Care    Pt here to establish care    HPI Micheal Nelson presents to establish care   Outpatient Encounter Medications as of 04/12/2024  Medication Sig   atorvastatin  (LIPITOR) 40 MG tablet Take 1/2 tablet (20 mg total) by mouth daily.   Blood Glucose Monitoring Suppl (ACCU-CHEK GUIDE ME) w/Device KIT Use to monitor glucose once daily   cetirizine -pseudoephedrine  (ZYRTEC -D) 5-120 MG tablet Take 1 tablet by mouth daily.   fluticasone  (FLONASE ) 50 MCG/ACT nasal spray Place 2 sprays into both nostrils daily.   glucose blood (FREESTYLE LITE) test strip Use as instructed to monitor glucose once daily.   Lancets (FREESTYLE) lancets Use as instructed to monitor glucose once daily   levocetirizine (XYZAL) 5 MG tablet Take 5 mg by mouth every evening.   lisinopril  (ZESTRIL ) 2.5 MG tablet Take 1 tablet (2.5 mg total) by mouth daily.   metFORMIN  (GLUCOPHAGE ) 500 MG tablet Take 1 tablet (500 mg total) by mouth 2 (two) times daily with a meal.   omeprazole  (PRILOSEC) 20 MG capsule Take 1 capsule (20 mg total) by mouth 2 (two) times daily before a meal.   promethazine -dextromethorphan (PROMETHAZINE -DM) 6.25-15 MG/5ML syrup Take 5 mLs by mouth 4 (four) times daily as needed for cough.   Semaglutide  (RYBELSUS ) 14 MG TABS Take 1 tablet (14 mg total) by mouth daily.   No facility-administered encounter medications on file as of 04/12/2024.    Past Medical History:  Diagnosis Date   Diabetes (HCC)    High cholesterol     Past Surgical History:  Procedure Laterality Date   HERNIA REPAIR      Family History  Problem Relation Age of Onset   Healthy Mother    Healthy Father     Social History   Socioeconomic History   Marital status: Married    Spouse name: Not on file   Number of children: 4    Years of education: Not on file   Highest education level: Not on file  Occupational History   Not on file  Tobacco Use   Smoking status: Never   Smokeless tobacco: Former    Types: Chew    Quit date: 10/18/2006  Vaping Use   Vaping status: Never Used  Substance and Sexual Activity   Alcohol use: Never   Drug use: Never   Sexual activity: Yes  Other Topics Concern   Not on file  Social History Narrative   Not on file   Social Drivers of Health   Financial Resource Strain: Not on file  Food Insecurity: No Food Insecurity (04/12/2024)   Hunger Vital Sign    Worried About Running Out of Food in the Last Year: Never true    Ran Out of Food in the Last Year: Never true  Transportation Needs: No Transportation Needs (04/12/2024)   PRAPARE - Administrator, Civil Service (Medical): No    Lack of Transportation (Non-Medical): No  Physical Activity: Not on file  Stress: Not on file  Social Connections: Not on file  Intimate Partner Violence: Not At Risk (04/12/2024)   Humiliation, Afraid, Rape, and Kick questionnaire    Fear of Current or Ex-Partner: No    Emotionally Abused: No    Physically Abused: No  Sexually Abused: No    ROS      Objective    BP 115/78   Pulse 83   Ht 5' 11 (1.803 m)   Wt 264 lb (119.7 kg)   SpO2 97%   BMI 36.82 kg/m   Physical Exam Vitals and nursing note reviewed.  Constitutional:      Appearance: Normal appearance.  HENT:     Head: Normocephalic.     Right Ear: Tympanic membrane, ear canal and external ear normal.     Left Ear: Tympanic membrane, ear canal and external ear normal.     Nose: Nose normal.     Mouth/Throat:     Mouth: Mucous membranes are moist.     Pharynx: Oropharynx is clear.   Cardiovascular:     Rate and Rhythm: Normal rate and regular rhythm.  Pulmonary:     Effort: Pulmonary effort is normal.     Breath sounds: Normal breath sounds.   Musculoskeletal:     Cervical back: Normal range of motion  and neck supple.   Skin:    General: Skin is warm and dry.   Neurological:     Mental Status: He is alert and oriented to person, place, and time.   Psychiatric:        Mood and Affect: Mood normal.        Thought Content: Thought content normal.         Assessment & Plan:   Problem List Items Addressed This Visit   None Visit Diagnoses       Encounter for preventative adult health care examination    -  Primary   Relevant Orders   CMP14+EGFR (Completed)   Lipid Profile (Completed)   TSH + free T4 (Completed)     Prostate cancer screening       Relevant Orders   PSA (Completed)     Screening for colon cancer       Relevant Orders   Cologuard     Need for hepatitis C screening test       Relevant Orders   Hepatitis C Antibody (Completed)       Return in about 1 year (around 04/12/2025) for for yearly physical.   Leita Longs, FNP

## 2024-04-13 LAB — CMP14+EGFR
ALT: 37 IU/L (ref 0–44)
AST: 22 IU/L (ref 0–40)
Albumin: 4.7 g/dL (ref 3.8–4.9)
Alkaline Phosphatase: 96 IU/L (ref 44–121)
BUN/Creatinine Ratio: 18 (ref 9–20)
BUN: 13 mg/dL (ref 6–24)
Bilirubin Total: 0.7 mg/dL (ref 0.0–1.2)
CO2: 22 mmol/L (ref 20–29)
Calcium: 9.5 mg/dL (ref 8.7–10.2)
Chloride: 100 mmol/L (ref 96–106)
Creatinine, Ser: 0.71 mg/dL — ABNORMAL LOW (ref 0.76–1.27)
Globulin, Total: 2.5 g/dL (ref 1.5–4.5)
Glucose: 181 mg/dL — ABNORMAL HIGH (ref 70–99)
Potassium: 4.4 mmol/L (ref 3.5–5.2)
Sodium: 138 mmol/L (ref 134–144)
Total Protein: 7.2 g/dL (ref 6.0–8.5)
eGFR: 111 mL/min/{1.73_m2} (ref >59)

## 2024-04-13 LAB — HEPATITIS C ANTIBODY: Hep C Virus Ab: NONREACTIVE

## 2024-04-13 LAB — LIPID PANEL
Chol/HDL Ratio: 4.6 ratio (ref 0.0–5.0)
Cholesterol, Total: 148 mg/dL (ref 100–199)
HDL: 32 mg/dL — ABNORMAL LOW (ref >39)
LDL Chol Calc (NIH): 82 mg/dL (ref 0–99)
Triglycerides: 202 mg/dL — ABNORMAL HIGH (ref 0–149)
VLDL Cholesterol Cal: 34 mg/dL (ref 5–40)

## 2024-04-13 LAB — PSA: Prostate Specific Ag, Serum: 0.3 ng/mL (ref 0.0–4.0)

## 2024-04-13 LAB — TSH+FREE T4
Free T4: 0.99 ng/dL (ref 0.82–1.77)
TSH: 1.55 u[IU]/mL (ref 0.450–4.500)

## 2024-04-18 ENCOUNTER — Ambulatory Visit: Admitting: Nurse Practitioner

## 2024-04-18 ENCOUNTER — Encounter: Payer: Self-pay | Admitting: Nurse Practitioner

## 2024-04-18 ENCOUNTER — Other Ambulatory Visit (HOSPITAL_COMMUNITY): Payer: Self-pay

## 2024-04-18 VITALS — BP 120/80 | HR 66 | Ht 71.0 in | Wt 262.2 lb

## 2024-04-18 DIAGNOSIS — E782 Mixed hyperlipidemia: Secondary | ICD-10-CM | POA: Diagnosis not present

## 2024-04-18 DIAGNOSIS — Z7984 Long term (current) use of oral hypoglycemic drugs: Secondary | ICD-10-CM | POA: Diagnosis not present

## 2024-04-18 DIAGNOSIS — E559 Vitamin D deficiency, unspecified: Secondary | ICD-10-CM

## 2024-04-18 DIAGNOSIS — E119 Type 2 diabetes mellitus without complications: Secondary | ICD-10-CM | POA: Diagnosis not present

## 2024-04-18 DIAGNOSIS — I10 Essential (primary) hypertension: Secondary | ICD-10-CM

## 2024-04-18 LAB — POCT GLYCOSYLATED HEMOGLOBIN (HGB A1C): Hemoglobin A1C: 8.7 % — AB (ref 4.0–5.6)

## 2024-04-18 LAB — POCT UA - MICROALBUMIN
Albumin/Creatinine Ratio, Urine, POC: <30
Creatinine, POC: 200 mg/dL
Microalbumin Ur, POC: 30 mg/L

## 2024-04-18 MED ORDER — TIRZEPATIDE 7.5 MG/0.5ML ~~LOC~~ SOAJ
7.5000 mg | SUBCUTANEOUS | 1 refills | Status: DC
  Filled 2024-04-18: qty 2, 28d supply, fill #0
  Filled 2024-05-14: qty 2, 28d supply, fill #1
  Filled 2024-06-08: qty 2, 28d supply, fill #2
  Filled 2024-07-06: qty 2, 28d supply, fill #3

## 2024-04-18 MED ORDER — METFORMIN HCL 500 MG PO TABS
500.0000 mg | ORAL_TABLET | Freq: Two times a day (BID) | ORAL | 3 refills | Status: AC
  Filled 2024-04-18 – 2024-05-14 (×2): qty 180, 90d supply, fill #0
  Filled 2024-08-22: qty 180, 90d supply, fill #1
  Filled 2024-11-19: qty 180, 90d supply, fill #0

## 2024-04-18 NOTE — Progress Notes (Signed)
 Endocrinology Follow Up Note       04/18/2024, 8:26 AM   Subjective:    Patient ID: Micheal Nelson, male    DOB: [birth year].  Micheal Nelson is being seen in follow up after being seen in consultation for management of currently uncontrolled symptomatic diabetes requested by  Bevely Doffing, FNP.   Past Medical History:  Diagnosis Date   Diabetes (HCC)    High cholesterol     Past Surgical History:  Procedure Laterality Date   HERNIA REPAIR      Social History   Socioeconomic History   Marital status: Married    Spouse name: Not on file   Number of children: 4   Years of education: Not on file   Highest education level: Not on file  Occupational History   Not on file  Tobacco Use   Smoking status: Never   Smokeless tobacco: Former    Types: Chew    Quit date: 10/18/2006  Vaping Use   Vaping status: Never Used  Substance and Sexual Activity   Alcohol use: Never   Drug use: Never   Sexual activity: Yes  Other Topics Concern   Not on file  Social History Narrative   Not on file   Social Drivers of Health   Financial Resource Strain: Not on file  Food Insecurity: No Food Insecurity (04/12/2024)   Hunger Vital Sign    Worried About Running Out of Food in the Last Year: Never true    Ran Out of Food in the Last Year: Never true  Transportation Needs: No Transportation Needs (04/12/2024)   PRAPARE - Administrator, Civil Service (Medical): No    Lack of Transportation (Non-Medical): No  Physical Activity: Not on file  Stress: Not on file  Social Connections: Not on file    Family History  Problem Relation Age of Onset   Healthy Mother    Healthy Father     Outpatient Encounter Medications as of 04/18/2024  Medication Sig   atorvastatin  (LIPITOR) 40 MG tablet Take 1/2 tablet (20 mg total) by mouth daily.   Blood Glucose Monitoring Suppl (ACCU-CHEK GUIDE ME) w/Device KIT  Use to monitor glucose once daily   fluticasone  (FLONASE ) 50 MCG/ACT nasal spray Place 2 sprays into both nostrils daily.   glucose blood (FREESTYLE LITE) test strip Use as instructed to monitor glucose once daily.   Lancets (FREESTYLE) lancets Use as instructed to monitor glucose once daily   levocetirizine (XYZAL) 5 MG tablet Take 5 mg by mouth every evening.   lisinopril  (ZESTRIL ) 2.5 MG tablet Take 1 tablet (2.5 mg total) by mouth daily.   omeprazole  (PRILOSEC) 20 MG capsule Take 1 capsule (20 mg total) by mouth 2 (two) times daily before a meal.   promethazine -dextromethorphan (PROMETHAZINE -DM) 6.25-15 MG/5ML syrup Take 5 mLs by mouth 4 (four) times daily as needed for cough.   tirzepatide (MOUNJARO) 7.5 MG/0.5ML Pen Inject 7.5 mg into the skin once a week.   [DISCONTINUED] metFORMIN  (GLUCOPHAGE ) 500 MG tablet Take 1 tablet (500 mg total) by mouth 2 (two) times daily with a meal.   [DISCONTINUED] Semaglutide  (RYBELSUS ) 14 MG TABS Take 1 tablet (  14 mg total) by mouth daily.   cetirizine -pseudoephedrine  (ZYRTEC -D) 5-120 MG tablet Take 1 tablet by mouth daily.   metFORMIN  (GLUCOPHAGE ) 500 MG tablet Take 1 tablet (500 mg total) by mouth 2 (two) times daily with a meal.   No facility-administered encounter medications on file as of 04/18/2024.    ALLERGIES: No Known Allergies  VACCINATION STATUS: Immunization History  Administered Date(s) Administered   Moderna Sars-Covid-2 Vaccination 01/18/2020, 02/19/2020    Diabetes He presents for his follow-up diabetic visit. He has type 2 diabetes mellitus. Onset time: Was diagnosed at approx age of 53. His disease course has been stable. There are no hypoglycemic associated symptoms. Pertinent negatives for diabetes include no fatigue, no polyuria and no weight loss. There are no hypoglycemic complications. Symptoms are stable. There are no diabetic complications. Risk factors for coronary artery disease include diabetes mellitus, dyslipidemia,  family history, hypertension, male sex and obesity. Current diabetic treatment includes oral agent (dual therapy). He is compliant with treatment most of the time. His weight is fluctuating minimally. He is following a generally unhealthy diet. When asked about meal planning, he reported none. He has not had a previous visit with a dietitian. He participates in exercise every other day. His home blood glucose trend is increasing steadily. His breakfast blood glucose range is generally 180-200 mg/dl. (He presents today with no meter or logs to review.  Has been checking fasting glucose and reports readings around 180-225.  His POCT A1c today is 8.7%, worsening from last visit of 7%.  He notes his diet still needs work.) An ACE inhibitor/angiotensin II receptor blocker is being taken. He does not see a podiatrist.Eye exam is current.  Hyperlipidemia This is a chronic problem. The current episode started more than 1 year ago. The problem is controlled. Recent lipid tests were reviewed and are normal. Exacerbating diseases include diabetes and obesity. Factors aggravating his hyperlipidemia include fatty foods. Current antihyperlipidemic treatment includes statins and herbal therapy. The current treatment provides moderate improvement of lipids. Compliance problems include adherence to diet and adherence to exercise.  Risk factors for coronary artery disease include diabetes mellitus, dyslipidemia, family history, hypertension, male sex and obesity.  Hypertension This is a chronic problem. The current episode started more than 1 year ago. The problem has been resolved since onset. The problem is controlled. There are no associated agents to hypertension. Risk factors for coronary artery disease include diabetes mellitus, dyslipidemia, family history, male gender and obesity. Past treatments include ACE inhibitors. The current treatment provides significant improvement. There are no compliance problems.     Review  of systems  Constitutional: + Minimally fluctuating body weight,  current Body mass index is 36.57 kg/m. , no fatigue, no subjective hyperthermia, no subjective hypothermia Eyes: no blurry vision, no xerophthalmia ENT: no sore throat, no nodules palpated in throat, no dysphagia/odynophagia, no hoarseness Cardiovascular: no chest pain, no shortness of breath, no palpitations, no leg swelling Respiratory: no cough, no shortness of breath Gastrointestinal: no nausea/vomiting/diarrhea Musculoskeletal: no muscle/joint aches Skin: no rashes, no hyperemia Neurological: no tremors, no numbness, no tingling, no dizziness Psychiatric: no depression, no anxiety  Objective:     BP 120/80 (BP Location: Left Arm, Patient Position: Sitting, Cuff Size: Large)   Pulse 66   Ht 5' 11 (1.803 m)   Wt 262 lb 3.2 oz (118.9 kg)   BMI 36.57 kg/m   Wt Readings from Last 3 Encounters:  04/18/24 262 lb 3.2 oz (118.9 kg)  04/12/24 264 lb (119.7  kg)  10/28/23 262 lb (118.8 kg)     BP Readings from Last 3 Encounters:  04/18/24 120/80  04/12/24 115/78  10/28/23 108/70      Physical Exam- Limited  Constitutional:  Body mass index is 36.57 kg/m. , not in acute distress, normal state of mind Eyes:  EOMI, no exophthalmos Musculoskeletal: no gross deformities, strength intact in all four extremities, no gross restriction of joint movements Skin:  no rashes, no hyperemia Neurological: no tremor with outstretched hands  Diabetic Foot Exam - Simple   Simple Foot Form Diabetic Foot exam was performed with the following findings: Yes 04/18/2024  8:22 AM  Visual Inspection No deformities, no ulcerations, no other skin breakdown bilaterally: Yes Sensation Testing Intact to touch and monofilament testing bilaterally: Yes Pulse Check Posterior Tibialis and Dorsalis pulse intact bilaterally: Yes Comments     CMP ( most recent) CMP     Component Value Date/Time   NA 138 04/12/2024 1115   K 4.4  04/12/2024 1115   CL 100 04/12/2024 1115   CO2 22 04/12/2024 1115   GLUCOSE 181 (H) 04/12/2024 1115   GLUCOSE 103 (H) 09/11/2007 1032   BUN 13 04/12/2024 1115   CREATININE 0.71 (L) 04/12/2024 1115   CALCIUM  9.5 04/12/2024 1115   PROT 7.2 04/12/2024 1115   ALBUMIN 4.7 04/12/2024 1115   AST 22 04/12/2024 1115   ALT 37 04/12/2024 1115   ALKPHOS 96 04/12/2024 1115   BILITOT 0.7 04/12/2024 1115   GFRNONAA 109 10/23/2020 0000   GFRAA  09/11/2007 1032    >60        The eGFR has been calculated using the MDRD equation. This calculation has not been validated in all clinical     Diabetic Labs (most recent): Lab Results  Component Value Date   HGBA1C 8.7 (A) 04/18/2024   HGBA1C 7.0 10/28/2023   HGBA1C 6.0 (A) 06/21/2023   MICROALBUR 30 mg/L 04/18/2024   MICROALBUR 10 mg/L 02/16/2023   MICROALBUR 10 02/11/2022     Lipid Panel ( most recent) Lipid Panel     Component Value Date/Time   CHOL 148 04/12/2024 1115   TRIG 202 (H) 04/12/2024 1115   HDL 32 (L) 04/12/2024 1115   CHOLHDL 4.6 04/12/2024 1115   LDLCALC 82 04/12/2024 1115   LABVLDL 34 04/12/2024 1115      Lab Results  Component Value Date   TSH 1.550 04/12/2024   TSH 1.310 02/11/2023   TSH 2.230 11/11/2021   FREET4 0.99 04/12/2024   FREET4 1.16 02/11/2023   FREET4 1.14 11/11/2021           Assessment & Plan:   1) Type 2 diabetes mellitus without complication, without long-term current use of insulin (HCC)  He presents today with no meter or logs to review.  Has been checking fasting glucose and reports readings around 180-225.  His POCT A1c today is 8.7%, worsening from last visit of 7%.  He notes his diet still needs work.  - Micheal Nelson has currently uncontrolled symptomatic type 2 DM since 53 years of age.  -Recent labs reviewed.  POCT UM today was normal.  - I had a long discussion with him about the progressive nature of diabetes and the pathology behind its complications. -his diabetes is  not currently complicated but he remains at a high risk for more acute and chronic complications which include CAD, CVA, CKD, retinopathy, and neuropathy. These are all discussed in detail with him.  The following Lifestyle  Medicine recommendations according to American College of Lifestyle Medicine [facility]) were discussed and offered to patient and he agrees to start the journey:  A. Whole Foods, Plant-based plate comprising of fruits and vegetables, plant-based proteins, whole-grain carbohydrates was discussed in detail with the patient.   A list for source of those nutrients were also provided to the patient.  Patient will use only water or unsweetened tea for hydration. B.  The need to stay away from risky substances including alcohol, smoking; obtaining 7 to 9 hours of restorative sleep, at least 150 minutes of moderate intensity exercise weekly, the importance of healthy social connections,  and stress reduction techniques were discussed. C.  A full color page of  Calorie density of various food groups per pound showing examples of each food groups was provided to the patient.  - Nutritional counseling repeated at each appointment due to patients tendency to fall back in to old habits.  - The patient admits there is a room for improvement in their diet and drink choices. -  Suggestion is made for the patient to avoid simple carbohydrates from their diet including Cakes, Sweet Desserts / Pastries, Ice Cream, Soda (diet and regular), Sweet Tea, Candies, Chips, Cookies, Sweet Pastries, Store Bought Juices, Alcohol in Excess of 1-2 drinks a day, Artificial Sweeteners, Coffee Creamer, and Sugar-free Products. This will help patient to have stable blood glucose profile and potentially avoid unintended weight gain.   - I encouraged the patient to switch to unprocessed or minimally processed complex starch and increased protein intake (animal or plant source), fruits, and vegetables.   - Patient is  advised to stick to a routine mealtimes to eat 3 meals a day and avoid unnecessary snacks (to snack only to correct hypoglycemia).  - I have approached him with the following individualized plan to manage his diabetes and patient agrees:   -He is advised to continue Metformin  500 mg po twice daily with meals.  Will switch him from GLP1 therapy to GIP therapy given suboptimal results with the Rybelsus .  Will try Mounjaro 7.5 mg SQ weekly.  We went over proper injection technique today.  He also notes his wife would be able to help him to inject if needed.  He is advised to continue monitoring blood glucose at least once daily 2-3 days a week, before breakfast and to call the clinic if he has readings less than 70 or greater than 200 for 3 tests in a row.   I did send in new meter for him and also gave him information on Stelo.  - Specific targets for  A1c; LDL, HDL, and Triglycerides were discussed with the patient.  2) Blood Pressure /Hypertension:  his blood pressure is controlled to target.   he is advised to continue his current medications including Lisinopril  2.5 mg p.o. daily with breakfast.  3) Lipids/Hyperlipidemia:    Review of his recent lipid panel from 04/12/24 showed controlled LDL at 82 and elevated triglycerides of 202 .  he is advised to continue his Atorvastatin  20 mg daily at bedtime.  Side effects and precautions discussed with him.   4)  Weight/Diet:  his Body mass index is 36.57 kg/m.  -  clearly complicating his diabetes care.   he is a candidate for weight loss. I discussed with him the fact that loss of 5 - 10% of his  current body weight will have the most impact on his diabetes management.  Exercise, and detailed carbohydrates information provided  -  detailed on discharge instructions.    5) Vitamin D  Deficiency: His most recent vitamin D  level was 29.8 on 02/11/23.  He is advised to continue OTC vitamin D3 5000 units po daily.    6) Chronic Care/Health  Maintenance: -he is on ACEI/ARB and Statin medications and is encouraged to initiate and continue to follow up with Ophthalmology, Dentist, Podiatrist at least yearly or according to recommendations, and advised to stay away from smoking. I have recommended yearly flu vaccine and pneumonia vaccine at least every 5 years; moderate intensity exercise for up to 150 minutes weekly; and sleep for at least 7 hours a day.  - he is advised to maintain close follow up with Bevely Doffing, FNP for primary care needs, as well as his other providers for optimal and coordinated care.     I spent  36  minutes in the care of the patient today including review of labs from CMP, Lipids, Thyroid  Function, Hematology (current and previous including abstractions from other facilities); face-to-face time discussing  his blood glucose readings/logs, discussing hypoglycemia and hyperglycemia episodes and symptoms, medications doses, his options of short and long term treatment based on the latest standards of care / guidelines;  discussion about incorporating lifestyle medicine;  and documenting the encounter. Risk reduction counseling performed per USPSTF guidelines to reduce obesity and cardiovascular risk factors.     Please refer to Patient Instructions for Blood Glucose Monitoring and Insulin/Medications Dosing Guide  in media tab for additional information. Please  also refer to  Patient Self Inventory in the Media  tab for reviewed elements of pertinent patient history.  Nelwyn KANDICE Anon participated in the discussions, expressed understanding, and voiced agreement with the above plans.  All questions were answered to his satisfaction. he is encouraged to contact clinic should he have any questions or concerns prior to his return visit.     Follow up plan: - Return in about 3 months (around 07/19/2024) for Diabetes F/U with A1c in office, No previsit labs.  Benton Rio, [facility] [address] [place], [place] [address] Phone: [phone] Fax: [phone]  04/18/2024, 8:26 AM

## 2024-05-09 ENCOUNTER — Ambulatory Visit: Payer: Self-pay

## 2024-05-14 ENCOUNTER — Other Ambulatory Visit (HOSPITAL_COMMUNITY): Payer: Self-pay

## 2024-05-14 ENCOUNTER — Other Ambulatory Visit: Payer: Self-pay

## 2024-05-16 DIAGNOSIS — Z1211 Encounter for screening for malignant neoplasm of colon: Secondary | ICD-10-CM | POA: Diagnosis not present

## 2024-05-24 LAB — COLOGUARD: COLOGUARD: NEGATIVE

## 2024-06-08 ENCOUNTER — Other Ambulatory Visit: Payer: Self-pay

## 2024-06-08 ENCOUNTER — Other Ambulatory Visit: Payer: Self-pay | Admitting: Nurse Practitioner

## 2024-06-11 ENCOUNTER — Other Ambulatory Visit (HOSPITAL_COMMUNITY): Payer: Self-pay

## 2024-06-11 MED ORDER — ATORVASTATIN CALCIUM 40 MG PO TABS
20.0000 mg | ORAL_TABLET | Freq: Every day | ORAL | 0 refills | Status: DC
  Filled 2024-06-11: qty 45, 90d supply, fill #0

## 2024-07-20 ENCOUNTER — Ambulatory Visit: Admitting: Nurse Practitioner

## 2024-07-20 ENCOUNTER — Other Ambulatory Visit (HOSPITAL_COMMUNITY): Payer: Self-pay

## 2024-07-20 ENCOUNTER — Encounter: Payer: Self-pay | Admitting: Nurse Practitioner

## 2024-07-20 VITALS — BP 118/72 | HR 83 | Ht 71.0 in | Wt 251.4 lb

## 2024-07-20 DIAGNOSIS — E119 Type 2 diabetes mellitus without complications: Secondary | ICD-10-CM

## 2024-07-20 DIAGNOSIS — Z7984 Long term (current) use of oral hypoglycemic drugs: Secondary | ICD-10-CM

## 2024-07-20 DIAGNOSIS — E782 Mixed hyperlipidemia: Secondary | ICD-10-CM | POA: Diagnosis not present

## 2024-07-20 DIAGNOSIS — I10 Essential (primary) hypertension: Secondary | ICD-10-CM

## 2024-07-20 DIAGNOSIS — E559 Vitamin D deficiency, unspecified: Secondary | ICD-10-CM | POA: Diagnosis not present

## 2024-07-20 LAB — POCT GLYCOSYLATED HEMOGLOBIN (HGB A1C): Hemoglobin A1C: 6.3 % — AB (ref 4.0–5.6)

## 2024-07-20 MED ORDER — TIRZEPATIDE 10 MG/0.5ML ~~LOC~~ SOAJ
10.0000 mg | SUBCUTANEOUS | 1 refills | Status: AC
  Filled 2024-07-20: qty 6, 84d supply, fill #0
  Filled 2024-07-30 – 2024-08-28 (×2): qty 2, 28d supply, fill #0
  Filled 2024-09-25: qty 2, 28d supply, fill #1
  Filled 2024-10-22: qty 2, 28d supply, fill #0
  Filled 2024-11-19: qty 2, 28d supply, fill #1

## 2024-07-20 NOTE — Progress Notes (Signed)
 Endocrinology Follow Up Note       07/20/2024, 8:44 AM   Subjective:    Patient ID: Micheal Nelson, male    DOB: 1971-06-10.  Micheal Nelson is being seen in follow up after being seen in consultation for management of currently uncontrolled symptomatic diabetes requested by  Bevely Doffing, FNP.   Past Medical History:  Diagnosis Date   Diabetes (HCC)    High cholesterol     Past Surgical History:  Procedure Laterality Date   HERNIA REPAIR      Social History   Socioeconomic History   Marital status: Married    Spouse name: Not on file   Number of children: 4   Years of education: Not on file   Highest education level: Not on file  Occupational History   Not on file  Tobacco Use   Smoking status: Never   Smokeless tobacco: Former    Types: Chew    Quit date: 10/18/2006  Vaping Use   Vaping status: Never Used  Substance and Sexual Activity   Alcohol use: Never   Drug use: Never   Sexual activity: Yes  Other Topics Concern   Not on file  Social History Narrative   Not on file   Social Drivers of Health   Financial Resource Strain: Not on file  Food Insecurity: No Food Insecurity (04/12/2024)   Hunger Vital Sign    Worried About Running Out of Food in the Last Year: Never true    Ran Out of Food in the Last Year: Never true  Transportation Needs: No Transportation Needs (04/12/2024)   PRAPARE - Administrator, Civil Service (Medical): No    Lack of Transportation (Non-Medical): No  Physical Activity: Not on file  Stress: Not on file  Social Connections: Not on file    Family History  Problem Relation Age of Onset   Healthy Mother    Healthy Father     Outpatient Encounter Medications as of 07/20/2024  Medication Sig   atorvastatin  (LIPITOR) 40 MG tablet Take 1/2 tablet (20 mg total) by mouth daily.   Blood Glucose Monitoring Suppl (ACCU-CHEK GUIDE ME) w/Device  KIT Use to monitor glucose once daily   cetirizine -pseudoephedrine  (ZYRTEC -D) 5-120 MG tablet Take 1 tablet by mouth daily.   glucose blood (FREESTYLE LITE) test strip Use as instructed to monitor glucose once daily.   Lancets (FREESTYLE) lancets Use as instructed to monitor glucose once daily   lisinopril  (ZESTRIL ) 2.5 MG tablet Take 1 tablet (2.5 mg total) by mouth daily.   metFORMIN  (GLUCOPHAGE ) 500 MG tablet Take 1 tablet (500 mg total) by mouth 2 (two) times daily with a meal.   tirzepatide  (MOUNJARO ) 10 MG/0.5ML Pen Inject 10 mg into the skin once a week.   [DISCONTINUED] tirzepatide  (MOUNJARO ) 7.5 MG/0.5ML Pen Inject 7.5 mg into the skin once a week.   [DISCONTINUED] fluticasone  (FLONASE ) 50 MCG/ACT nasal spray Place 2 sprays into both nostrils daily. (Patient not taking: Reported on 07/20/2024)   [DISCONTINUED] levocetirizine (XYZAL) 5 MG tablet Take 5 mg by mouth every evening. (Patient not taking: Reported on 07/20/2024)   [DISCONTINUED] omeprazole  (PRILOSEC) 20 MG capsule Take 1  capsule (20 mg total) by mouth 2 (two) times daily before a meal. (Patient not taking: Reported on 07/20/2024)   [DISCONTINUED] promethazine -dextromethorphan (PROMETHAZINE -DM) 6.25-15 MG/5ML syrup Take 5 mLs by mouth 4 (four) times daily as needed for cough. (Patient not taking: Reported on 07/20/2024)   No facility-administered encounter medications on file as of 07/20/2024.    ALLERGIES: No Known Allergies  VACCINATION STATUS: Immunization History  Administered Date(s) Administered   Moderna Sars-Covid-2 Vaccination 01/18/2020, 02/19/2020    Diabetes He presents for his follow-up diabetic visit. He has type 2 diabetes mellitus. Onset time: Was diagnosed at approx age of 30. His disease course has been improving. There are no hypoglycemic associated symptoms. Associated symptoms include weight loss. Pertinent negatives for diabetes include no fatigue and no polyuria. There are no hypoglycemic complications.  Symptoms are stable. There are no diabetic complications. Risk factors for coronary artery disease include diabetes mellitus, dyslipidemia, family history, hypertension, male sex and obesity. Current diabetic treatment includes oral agent (monotherapy) (and Mounjaro ). He is compliant with treatment most of the time. His weight is decreasing steadily. He is following a generally unhealthy diet. When asked about meal planning, he reported none. He has not had a previous visit with a dietitian. He participates in exercise every other day. His home blood glucose trend is decreasing steadily. His breakfast blood glucose range is generally 90-110 mg/dl. (He presents today with no meter or logs to review, he forgot them at home but reports his fasting glucose is in the 100-115 range.  His POCT A1c today is 6.3%, improving from last visit of 8.7%.  He denies any unpleasant side effects from the Mounjaro , notes his sugar cravings are gone.  He has also lost some weight with the Mounjaro  as well.) An ACE inhibitor/angiotensin II receptor blocker is being taken. He does not see a podiatrist.Eye exam is current.    Review of systems  Constitutional: + Minimally fluctuating body weight,  current Body mass index is 35.06 kg/m. , no fatigue, no subjective hyperthermia, no subjective hypothermia Eyes: no blurry vision, no xerophthalmia ENT: no sore throat, no nodules palpated in throat, no dysphagia/odynophagia, no hoarseness Cardiovascular: no chest pain, no shortness of breath, no palpitations, no leg swelling Respiratory: no cough, no shortness of breath Gastrointestinal: no nausea/vomiting/diarrhea Musculoskeletal: no muscle/joint aches Skin: no rashes, no hyperemia Neurological: no tremors, no numbness, no tingling, no dizziness Psychiatric: no depression, no anxiety  Objective:     BP 118/72 (BP Location: Left Arm, Patient Position: Sitting, Cuff Size: Large)   Pulse 83   Ht 5' 11 (1.803 m)   Wt 251 lb  6.4 oz (114 kg)   BMI 35.06 kg/m   Wt Readings from Last 3 Encounters:  07/20/24 251 lb 6.4 oz (114 kg)  04/18/24 262 lb 3.2 oz (118.9 kg)  04/12/24 264 lb (119.7 kg)     BP Readings from Last 3 Encounters:  07/20/24 118/72  04/18/24 120/80  04/12/24 115/78      Physical Exam- Limited  Constitutional:  Body mass index is 35.06 kg/m. , not in acute distress, normal state of mind Eyes:  EOMI, no exophthalmos Musculoskeletal: no gross deformities, strength intact in all four extremities, no gross restriction of joint movements Skin:  no rashes, no hyperemia Neurological: no tremor with outstretched hands  Diabetic Foot Exam - Simple   No data filed     CMP ( most recent) CMP     Component Value Date/Time   NA 138 04/12/2024 1115  K 4.4 04/12/2024 1115   CL 100 04/12/2024 1115   CO2 22 04/12/2024 1115   GLUCOSE 181 (H) 04/12/2024 1115   GLUCOSE 103 (H) 09/11/2007 1032   BUN 13 04/12/2024 1115   CREATININE 0.71 (L) 04/12/2024 1115   CALCIUM  9.5 04/12/2024 1115   PROT 7.2 04/12/2024 1115   ALBUMIN 4.7 04/12/2024 1115   AST 22 04/12/2024 1115   ALT 37 04/12/2024 1115   ALKPHOS 96 04/12/2024 1115   BILITOT 0.7 04/12/2024 1115   GFRNONAA 109 10/23/2020 0000   GFRAA  09/11/2007 1032    >60        The eGFR has been calculated using the MDRD equation. This calculation has not been validated in all clinical     Diabetic Labs (most recent): Lab Results  Component Value Date   HGBA1C 6.3 (A) 07/20/2024   HGBA1C 8.7 (A) 04/18/2024   HGBA1C 7.0 10/28/2023   MICROALBUR 30 mg/L 04/18/2024   MICROALBUR 10 mg/L 02/16/2023   MICROALBUR 10 02/11/2022     Lipid Panel ( most recent) Lipid Panel     Component Value Date/Time   CHOL 148 04/12/2024 1115   TRIG 202 (H) 04/12/2024 1115   HDL 32 (L) 04/12/2024 1115   CHOLHDL 4.6 04/12/2024 1115   LDLCALC 82 04/12/2024 1115   LABVLDL 34 04/12/2024 1115      Lab Results  Component Value Date   TSH 1.550 04/12/2024    TSH 1.310 02/11/2023   TSH 2.230 11/11/2021   FREET4 0.99 04/12/2024   FREET4 1.16 02/11/2023   FREET4 1.14 11/11/2021           Assessment & Plan:   1) Type 2 diabetes mellitus without complication, without long-term current use of insulin (HCC)  He presents today with no meter or logs to review, he forgot them at home but reports his fasting glucose is in the 100-115 range.  His POCT A1c today is 6.3%, improving from last visit of 8.7%.  He denies any unpleasant side effects from the Mounjaro , notes his sugar cravings are gone.  He has also lost some weight with the Mounjaro  as well.  - Micheal Nelson has currently uncontrolled symptomatic type 2 DM since 53 years of age.  -Recent labs reviewed.    - I had a long discussion with him about the progressive nature of diabetes and the pathology behind its complications. -his diabetes is not currently complicated but he remains at a high risk for more acute and chronic complications which include CAD, CVA, CKD, retinopathy, and neuropathy. These are all discussed in detail with him.  The following Lifestyle Medicine recommendations according to American College of Lifestyle Medicine [facility]) were discussed and offered to patient and he agrees to start the journey:  A. Whole Foods, Plant-based plate comprising of fruits and vegetables, plant-based proteins, whole-grain carbohydrates was discussed in detail with the patient.   A list for source of those nutrients were also provided to the patient.  Patient will use only water or unsweetened tea for hydration. B.  The need to stay away from risky substances including alcohol, smoking; obtaining 7 to 9 hours of restorative sleep, at least 150 minutes of moderate intensity exercise weekly, the importance of healthy social connections,  and stress reduction techniques were discussed. C.  A full color page of  Calorie density of various food groups per pound showing examples of each food groups  was provided to the patient.  - Nutritional counseling repeated at each  appointment due to patients tendency to fall back in to old habits.  - The patient admits there is a room for improvement in their diet and drink choices. -  Suggestion is made for the patient to avoid simple carbohydrates from their diet including Cakes, Sweet Desserts / Pastries, Ice Cream, Soda (diet and regular), Sweet Tea, Candies, Chips, Cookies, Sweet Pastries, Store Bought Juices, Alcohol in Excess of 1-2 drinks a day, Artificial Sweeteners, Coffee Creamer, and Sugar-free Products. This will help patient to have stable blood glucose profile and potentially avoid unintended weight gain.   - I encouraged the patient to switch to unprocessed or minimally processed complex starch and increased protein intake (animal or plant source), fruits, and vegetables.   - Patient is advised to stick to a routine mealtimes to eat 3 meals a day and avoid unnecessary snacks (to snack only to correct hypoglycemia).  - I have approached him with the following individualized plan to manage his diabetes and patient agrees:   -He is advised to lower Metformin  to 500 mg po ONCE daily with breakfast.  Will increase his Mounjaro  to 10 mg SQ weekly.   He can take a break from routine glucose monitoring given safe regimen.  - Specific targets for  A1c; LDL, HDL, and Triglycerides were discussed with the patient.  2) Blood Pressure /Hypertension:  his blood pressure is controlled to target.   he is advised to continue his current medications including Lisinopril  2.5 mg p.o. daily with breakfast.  3) Lipids/Hyperlipidemia:    Review of his recent lipid panel from 04/12/24 showed controlled LDL at 82 and elevated triglycerides of 202 .  he is advised to continue his Atorvastatin  20 mg daily at bedtime.  Side effects and precautions discussed with him.   4)  Weight/Diet:  his Body mass index is 35.06 kg/m.  -  clearly complicating his  diabetes care.   he is a candidate for weight loss. I discussed with him the fact that loss of 5 - 10% of his  current body weight will have the most impact on his diabetes management.  Exercise, and detailed carbohydrates information provided  -  detailed on discharge instructions.    5) Vitamin D  Deficiency: His most recent vitamin D  level was 29.8 on 02/11/23.  He is advised to continue OTC vitamin D3 5000 units po daily.    6) Chronic Care/Health Maintenance: -he is on ACEI/ARB and Statin medications and is encouraged to initiate and continue to follow up with Ophthalmology, Dentist, Podiatrist at least yearly or according to recommendations, and advised to stay away from smoking. I have recommended yearly flu vaccine and pneumonia vaccine at least every 5 years; moderate intensity exercise for up to 150 minutes weekly; and sleep for at least 7 hours a day.  - he is advised to maintain close follow up with Bevely Doffing, FNP for primary care needs, as well as his other providers for optimal and coordinated care.      I spent  52  minutes in the care of the patient today including review of labs from CMP, Lipids, Thyroid  Function, Hematology (current and previous including abstractions from other facilities); face-to-face time discussing  his blood glucose readings/logs, discussing hypoglycemia and hyperglycemia episodes and symptoms, medications doses, his options of short and long term treatment based on the latest standards of care / guidelines;  discussion about incorporating lifestyle medicine;  and documenting the encounter. Risk reduction counseling performed per USPSTF guidelines to reduce obesity and  cardiovascular risk factors.     Please refer to Patient Instructions for Blood Glucose Monitoring and Insulin/Medications Dosing Guide  in media tab for additional information. Please  also refer to  Patient Self Inventory in the Media  tab for reviewed elements of pertinent patient  history.  Micheal Nelson participated in the discussions, expressed understanding, and voiced agreement with the above plans.  All questions were answered to his satisfaction. he is encouraged to contact clinic should he have any questions or concerns prior to his return visit.     Follow up plan: - Return in about 4 months (around 11/20/2024) for Diabetes F/U with A1c in office, No previsit labs.  Benton Rio, [facility] [address] [place], [place] [address] Phone: [phone] Fax: [phone]  07/20/2024, 8:44 AM

## 2024-07-23 ENCOUNTER — Other Ambulatory Visit (HOSPITAL_COMMUNITY): Payer: Self-pay

## 2024-07-30 ENCOUNTER — Other Ambulatory Visit (HOSPITAL_COMMUNITY): Payer: Self-pay

## 2024-08-28 ENCOUNTER — Other Ambulatory Visit (HOSPITAL_BASED_OUTPATIENT_CLINIC_OR_DEPARTMENT_OTHER): Payer: Self-pay

## 2024-08-30 DIAGNOSIS — M25521 Pain in right elbow: Secondary | ICD-10-CM | POA: Diagnosis not present

## 2024-08-30 DIAGNOSIS — M25561 Pain in right knee: Secondary | ICD-10-CM | POA: Diagnosis not present

## 2024-09-20 ENCOUNTER — Other Ambulatory Visit: Payer: Self-pay | Admitting: Nurse Practitioner

## 2024-09-20 ENCOUNTER — Other Ambulatory Visit (HOSPITAL_BASED_OUTPATIENT_CLINIC_OR_DEPARTMENT_OTHER): Payer: Self-pay

## 2024-09-20 MED ORDER — ATORVASTATIN CALCIUM 40 MG PO TABS
20.0000 mg | ORAL_TABLET | Freq: Every day | ORAL | 0 refills | Status: DC
  Filled 2024-09-20: qty 45, 90d supply, fill #0

## 2024-09-25 ENCOUNTER — Other Ambulatory Visit (HOSPITAL_BASED_OUTPATIENT_CLINIC_OR_DEPARTMENT_OTHER): Payer: Self-pay

## 2024-09-28 ENCOUNTER — Ambulatory Visit

## 2024-09-28 DIAGNOSIS — Z125 Encounter for screening for malignant neoplasm of prostate: Secondary | ICD-10-CM | POA: Diagnosis not present

## 2024-09-28 DIAGNOSIS — R52 Pain, unspecified: Secondary | ICD-10-CM

## 2024-09-28 NOTE — Progress Notes (Unsigned)
 Established Patient Office Visit  Subjective   Patient ID: Micheal Nelson, male    DOB: [birth year]  Age: 53 y.o. MRN: [id number]  Chief Complaint  Patient presents with   Medical Management of Chronic Issues    Pt states he is here for aches and pains, possibly lost testosterone      HPI Discussed the use of AI scribe software for clinical note transcription with the patient, who gave verbal consent to proceed.  History of Present Illness    Micheal Nelson is a 53 year old male who presents with persistent joint aches and difficulty with mobility.  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, even with knee pads - Shuffling gait when getting out of bed - Knees and legs feel weak, especially after prolonged sitting - Stumbling upon standing after sitting for extended periods, such as at football games - Concerned about the degree of aches and pains at his age - No significant past injuries to explain symptoms  Right elbow pain - History of right elbow pain previously diagnosed as tennis elbow  Fatigue - Frequent fatigue, often falling asleep on the couch - Continues to push himself to stay active despite fatigue  Weight management and metabolic health - History of overweight and difficulty losing weight despite being active - Currently taking Mounjaro , which reduced A1c to 6.3 from over 8 in January - No significant weight loss with current therapy  Endocrine and nutritional considerations - No prior testosterone  level evaluation - Previous low vitamin D  level checked one year ago  Lifestyle factors - 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 - No alcohol use     Patient Active Problem List   Diagnosis Date Noted   Generalized body aches 09/30/2024   Essential  hypertension 03/31/2023   Mixed hyperlipidemia 03/31/2023   Obesity 03/31/2023   Type 2 diabetes mellitus (HCC) 04/06/2021   Pain in joint of right ankle 03/07/2018    ROS    Objective:     BP 105/74   Pulse 83   Ht 5' 11 (1.803 m)   Wt 259 lb 1.3 oz (117.5 kg)   SpO2 96%   BMI 36.13 kg/m  BP Readings from Last 3 Encounters:  09/28/24 105/74  07/20/24 118/72  04/18/24 120/80   Wt Readings from Last 3 Encounters:  09/28/24 259 lb 1.3 oz (117.5 kg)  07/20/24 251 lb 6.4 oz (114 kg)  04/18/24 262 lb 3.2 oz (118.9 kg)      Physical Exam Vitals and nursing note reviewed.  Constitutional:      Appearance: Normal appearance.  HENT:     Head: Normocephalic.  Eyes:     Extraocular Movements: Extraocular movements intact.     Pupils: Pupils are equal, round, and reactive to light.  Cardiovascular:     Rate and Rhythm: Normal rate and regular rhythm.  Pulmonary:     Effort: Pulmonary effort is normal.     Breath sounds: Normal breath sounds.  Musculoskeletal:     Cervical back: Normal range of motion and neck supple.  Neurological:     Mental Status: He is alert and oriented to person, place, and time.  Psychiatric:        Mood and Affect: Mood normal.        Thought Content: Thought content normal.  No results found for any visits on 09/28/24.  Last CBC Lab Results  Component Value Date   WBC 7.3 09/11/2007   HGB 16.5 10/23/2020   HCT 42.5 09/11/2007   MCV 89.7 09/11/2007   RDW 12.7 09/11/2007   PLT 278 09/11/2007   Last metabolic panel Lab Results  Component Value Date   GLUCOSE 181 (H) 04/12/2024   NA 138 04/12/2024   K 4.4 04/12/2024   CL 100 04/12/2024   CO2 22 04/12/2024   BUN 13 04/12/2024   CREATININE 0.71 (L) 04/12/2024   EGFR 111 04/12/2024   CALCIUM  9.5 04/12/2024   PROT 7.2 04/12/2024   ALBUMIN 4.7 04/12/2024   LABGLOB 2.5 04/12/2024   AGRATIO 2.0 02/11/2023   BILITOT 0.7 04/12/2024   ALKPHOS 96 04/12/2024   AST 22 04/12/2024    ALT 37 04/12/2024   Last lipids Lab Results  Component Value Date   CHOL 148 04/12/2024   HDL 32 (L) 04/12/2024   LDLCALC 82 04/12/2024   TRIG 202 (H) 04/12/2024   CHOLHDL 4.6 04/12/2024   Last hemoglobin A1c Lab Results  Component Value Date   HGBA1C 6.3 (A) 07/20/2024   Last thyroid  functions Lab Results  Component Value Date   TSH 1.550 04/12/2024   FREET4 0.99 04/12/2024   Last vitamin D  Lab Results  Component Value Date   VD25OH 29.8 (L) 02/11/2023    The 10-year ASCVD risk score (Arnett DK, et al., 2019) is: 6.3%    Assessment & Plan:   Problem List Items Addressed This Visit       Other   Generalized body aches - Primary   Chronic body aches for 1-1.5 years, worsened by activity and present at rest. Differential includes low testosterone , vitamin D  deficiency, thyroid  dysfunction. Previous low vitamin D  levels noted. - Ordered testosterone  level. - Ordered vitamin D  level. - Ordered thyroid  function tests. - Ordered CBC to rule out infection. - Recommended daily vitamin D  supplement with K2. - Scheduled lab work before 10 AM for accurate testosterone  reading.      Relevant Orders   Testosterone ,Free and Total   Vitamin D  (25 hydroxy)   CBC with Differential/Platelet   ANA w/Reflex   Rheumatoid Factor   Other Visit Diagnoses       Prostate cancer screening       Relevant Orders   PSA     The natural history of prostate cancer and ongoing controversy regarding screening and potential treatment outcomes of prostate cancer has been discussed with the patient. The meaning of a false positive PSA and a false negative PSA has been discussed. He indicates understanding of the limitations of this screening test and wishes to proceed with screening PSA testing.  No follow-ups on file.    Leita Longs, FNP

## 2024-09-30 DIAGNOSIS — R52 Pain, unspecified: Secondary | ICD-10-CM | POA: Insufficient documentation

## 2024-09-30 NOTE — Assessment & Plan Note (Signed)
 Chronic body aches for 1-1.5 years, worsened by activity and present at rest. Differential includes low testosterone , vitamin D  deficiency, thyroid  dysfunction. Previous low vitamin D  levels noted. - Ordered testosterone  level. - Ordered vitamin D  level. - Ordered thyroid  function tests. - Ordered CBC to rule out infection. - Recommended daily vitamin D  supplement with K2. - Scheduled lab work before 10 AM for accurate testosterone  reading.

## 2024-10-02 DIAGNOSIS — Z125 Encounter for screening for malignant neoplasm of prostate: Secondary | ICD-10-CM | POA: Diagnosis not present

## 2024-10-02 DIAGNOSIS — R52 Pain, unspecified: Secondary | ICD-10-CM | POA: Diagnosis not present

## 2024-10-04 LAB — CBC WITH DIFFERENTIAL/PLATELET
Basophils Absolute: 0 x10E3/uL (ref 0.0–0.2)
Basos: 1 %
EOS (ABSOLUTE): 0.1 x10E3/uL (ref 0.0–0.4)
Eos: 2 %
Hematocrit: 45.7 % (ref 37.5–51.0)
Hemoglobin: 15.3 g/dL (ref 13.0–17.7)
Immature Grans (Abs): 0 x10E3/uL (ref 0.0–0.1)
Immature Granulocytes: 0 %
Lymphocytes Absolute: 1.9 x10E3/uL (ref 0.7–3.1)
Lymphs: 35 %
MCH: 30.8 pg (ref 26.6–33.0)
MCHC: 33.5 g/dL (ref 31.5–35.7)
MCV: 92 fL (ref 79–97)
Monocytes Absolute: 0.5 x10E3/uL (ref 0.1–0.9)
Monocytes: 9 %
Neutrophils Absolute: 2.9 x10E3/uL (ref 1.4–7.0)
Neutrophils: 53 %
Platelets: 282 x10E3/uL (ref 150–450)
RBC: 4.97 x10E6/uL (ref 4.14–5.80)
RDW: 12 % (ref 11.6–15.4)
WBC: 5.4 x10E3/uL (ref 3.4–10.8)

## 2024-10-04 LAB — VITAMIN D 25 HYDROXY (VIT D DEFICIENCY, FRACTURES): Vit D, 25-Hydroxy: 18.9 ng/mL — ABNORMAL LOW (ref 30.0–100.0)

## 2024-10-04 LAB — TESTOSTERONE,FREE AND TOTAL
Testosterone, Free: 7.1 pg/mL — ABNORMAL LOW (ref 7.2–24.0)
Testosterone: 313 ng/dL (ref 264–916)

## 2024-10-04 LAB — RHEUMATOID FACTOR: Rheumatoid fact SerPl-aCnc: <10 [IU]/mL (ref <14.0)

## 2024-10-04 LAB — PSA: Prostate Specific Ag, Serum: 0.3 ng/mL (ref 0.0–4.0)

## 2024-10-04 LAB — ANA W/REFLEX

## 2024-10-22 ENCOUNTER — Other Ambulatory Visit: Payer: Self-pay

## 2024-10-22 ENCOUNTER — Ambulatory Visit: Payer: Self-pay

## 2024-10-22 ENCOUNTER — Other Ambulatory Visit (HOSPITAL_COMMUNITY): Payer: Self-pay

## 2024-10-22 ENCOUNTER — Other Ambulatory Visit (HOSPITAL_BASED_OUTPATIENT_CLINIC_OR_DEPARTMENT_OTHER): Payer: Self-pay

## 2024-10-22 ENCOUNTER — Other Ambulatory Visit: Payer: Self-pay | Admitting: Nurse Practitioner

## 2024-10-22 MED ORDER — ATORVASTATIN CALCIUM 40 MG PO TABS
20.0000 mg | ORAL_TABLET | Freq: Every day | ORAL | 0 refills | Status: AC
  Filled 2024-10-22: qty 45, 90d supply, fill #0

## 2024-10-23 ENCOUNTER — Other Ambulatory Visit: Payer: Self-pay

## 2024-11-20 ENCOUNTER — Other Ambulatory Visit (HOSPITAL_BASED_OUTPATIENT_CLINIC_OR_DEPARTMENT_OTHER): Payer: Self-pay

## 2024-11-20 ENCOUNTER — Ambulatory Visit: Admitting: Nurse Practitioner

## 2024-11-27 ENCOUNTER — Ambulatory Visit: Admitting: Nurse Practitioner

## 2025-04-15 ENCOUNTER — Encounter
